# Patient Record
Sex: Female | Born: 1990 | Race: Black or African American | Hispanic: No | Marital: Single | Smoking: Current every day smoker
Health system: Southern US, Community
[De-identification: ages and names within clinical notes are randomized; demographics above are authoritative.]

## PROBLEM LIST (undated history)

## (undated) ENCOUNTER — Emergency Department (HOSPITAL_COMMUNITY): Payer: Self-pay

## (undated) DIAGNOSIS — D649 Anemia, unspecified: Secondary | ICD-10-CM

## (undated) DIAGNOSIS — N289 Disorder of kidney and ureter, unspecified: Secondary | ICD-10-CM

## (undated) DIAGNOSIS — N200 Calculus of kidney: Secondary | ICD-10-CM

## (undated) DIAGNOSIS — E282 Polycystic ovarian syndrome: Secondary | ICD-10-CM

---
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
## 2010-06-30 ENCOUNTER — Encounter: Payer: Self-pay | Admitting: Pediatrics

## 2010-08-27 LAB — POCT RAPID STREP A (OFFICE): Streptococcus, Group A Screen (Direct): NEGATIVE

## 2010-12-25 ENCOUNTER — Other Ambulatory Visit (HOSPITAL_COMMUNITY): Payer: Self-pay | Admitting: Obstetrics and Gynecology

## 2010-12-25 DIAGNOSIS — R102 Pelvic and perineal pain: Secondary | ICD-10-CM

## 2010-12-25 DIAGNOSIS — E282 Polycystic ovarian syndrome: Secondary | ICD-10-CM

## 2010-12-30 ENCOUNTER — Ambulatory Visit (HOSPITAL_COMMUNITY)
Admission: RE | Admit: 2010-12-30 | Discharge: 2010-12-30 | Disposition: A | Discharge status: Home / Self Care | Payer: Medicaid Other | Source: Ambulatory Visit | Attending: Obstetrics and Gynecology | Admitting: Obstetrics and Gynecology

## 2010-12-30 DIAGNOSIS — N949 Unspecified condition associated with female genital organs and menstrual cycle: Secondary | ICD-10-CM | POA: Insufficient documentation

## 2010-12-30 DIAGNOSIS — R102 Pelvic and perineal pain: Secondary | ICD-10-CM

## 2010-12-30 DIAGNOSIS — E282 Polycystic ovarian syndrome: Secondary | ICD-10-CM

## 2010-12-30 DIAGNOSIS — N926 Irregular menstruation, unspecified: Secondary | ICD-10-CM | POA: Insufficient documentation

## 2011-01-18 ENCOUNTER — Emergency Department (HOSPITAL_COMMUNITY)
Admission: EM | Admit: 2011-01-18 | Discharge: 2011-01-18 | Disposition: A | Discharge status: Home / Self Care | Payer: Medicaid Other | Attending: Emergency Medicine | Admitting: Emergency Medicine

## 2011-01-18 ENCOUNTER — Emergency Department (HOSPITAL_COMMUNITY): Payer: Medicaid Other

## 2011-01-18 DIAGNOSIS — IMO0002 Reserved for concepts with insufficient information to code with codable children: Secondary | ICD-10-CM | POA: Insufficient documentation

## 2011-01-18 DIAGNOSIS — Y9355 Activity, bike riding: Secondary | ICD-10-CM | POA: Insufficient documentation

## 2011-01-18 DIAGNOSIS — N912 Amenorrhea, unspecified: Secondary | ICD-10-CM | POA: Insufficient documentation

## 2011-01-18 DIAGNOSIS — F988 Other specified behavioral and emotional disorders with onset usually occurring in childhood and adolescence: Secondary | ICD-10-CM | POA: Insufficient documentation

## 2011-01-18 DIAGNOSIS — S20219A Contusion of unspecified front wall of thorax, initial encounter: Secondary | ICD-10-CM | POA: Insufficient documentation

## 2011-01-18 DIAGNOSIS — R071 Chest pain on breathing: Secondary | ICD-10-CM | POA: Insufficient documentation

## 2011-02-27 ENCOUNTER — Inpatient Hospital Stay (INDEPENDENT_AMBULATORY_CARE_PROVIDER_SITE_OTHER)
Admission: RE | Admit: 2011-02-27 | Discharge: 2011-02-27 | Disposition: A | Discharge status: Home / Self Care | Payer: Medicaid Other | Source: Ambulatory Visit | Attending: Physician Assistant | Admitting: Physician Assistant

## 2011-02-27 ENCOUNTER — Ambulatory Visit (INDEPENDENT_AMBULATORY_CARE_PROVIDER_SITE_OTHER): Payer: Medicaid Other

## 2011-02-27 DIAGNOSIS — J019 Acute sinusitis, unspecified: Secondary | ICD-10-CM

## 2011-02-27 DIAGNOSIS — J45909 Unspecified asthma, uncomplicated: Secondary | ICD-10-CM

## 2011-02-27 LAB — POCT PREGNANCY, URINE: Preg Test, Ur: NEGATIVE

## 2011-03-06 LAB — CBC
HCT: 37.2
Hemoglobin: 11.9 — ABNORMAL LOW
MCV: 71 — ABNORMAL LOW
RDW: 13.5

## 2011-03-06 LAB — DIFFERENTIAL
Basophils Absolute: 0.1
Eosinophils Absolute: 0.3
Eosinophils Relative: 7 — ABNORMAL HIGH
Lymphocytes Relative: 36
Lymphs Abs: 1.8
Monocytes Absolute: 0.5

## 2011-03-06 LAB — URINALYSIS, ROUTINE W REFLEX MICROSCOPIC
Bilirubin Urine: NEGATIVE
Glucose, UA: NEGATIVE
Ketones, ur: NEGATIVE
Protein, ur: NEGATIVE
pH: 6.5

## 2011-03-06 LAB — POCT I-STAT, CHEM 8
BUN: 8
Chloride: 105
Creatinine, Ser: 0.7
Potassium: 3.6
Sodium: 141

## 2011-03-06 LAB — POCT PREGNANCY, URINE: Operator id: 284251

## 2011-03-06 LAB — URINE MICROSCOPIC-ADD ON

## 2011-03-06 LAB — GC/CHLAMYDIA PROBE AMP, GENITAL: GC Probe Amp, Genital: NEGATIVE

## 2011-05-22 ENCOUNTER — Emergency Department (HOSPITAL_COMMUNITY)
Admission: EM | Admit: 2011-05-22 | Discharge: 2011-05-22 | Disposition: A | Discharge status: Home / Self Care | Payer: Medicaid Other | Attending: Emergency Medicine | Admitting: Emergency Medicine

## 2011-05-22 DIAGNOSIS — R599 Enlarged lymph nodes, unspecified: Secondary | ICD-10-CM | POA: Insufficient documentation

## 2011-05-22 DIAGNOSIS — R220 Localized swelling, mass and lump, head: Secondary | ICD-10-CM | POA: Insufficient documentation

## 2011-05-22 DIAGNOSIS — J45909 Unspecified asthma, uncomplicated: Secondary | ICD-10-CM | POA: Insufficient documentation

## 2011-05-22 DIAGNOSIS — R51 Headache: Secondary | ICD-10-CM | POA: Insufficient documentation

## 2011-05-22 DIAGNOSIS — L089 Local infection of the skin and subcutaneous tissue, unspecified: Secondary | ICD-10-CM | POA: Insufficient documentation

## 2011-05-22 HISTORY — DX: Polycystic ovarian syndrome: E28.2

## 2011-05-22 MED ORDER — LIDOCAINE-EPINEPHRINE 1 %-1:100000 IJ SOLN
INTRAMUSCULAR | Status: AC
  Filled 2011-05-22: qty 1

## 2011-05-22 MED ORDER — CEPHALEXIN 250 MG PO CAPS
500.0000 mg | ORAL_CAPSULE | Freq: Once | ORAL | Status: AC
  Administered 2011-05-22: 500 mg via ORAL
  Filled 2011-05-22: qty 2

## 2011-05-22 MED ORDER — CEPHALEXIN 500 MG PO CAPS: 500.0000 mg | ORAL_CAPSULE | Freq: Two times a day (BID) | ORAL | Status: AC

## 2011-05-22 MED ORDER — HYDROCODONE-ACETAMINOPHEN 5-325 MG PO TABS
2.0000 | ORAL_TABLET | Freq: Once | ORAL | Status: AC
  Administered 2011-05-22: 2 via ORAL
  Filled 2011-05-22: qty 2

## 2011-05-22 MED ORDER — HYDROCODONE-ACETAMINOPHEN 5-325 MG PO TABS: 2.0000 | ORAL_TABLET | Freq: Four times a day (QID) | ORAL | Status: AC | PRN

## 2011-05-22 NOTE — ED Notes (Signed)
Pt. Had earring pplaced under her rt. Lip,the are is swollen, red and painful. Pt. Went to [facility] today and was given a tetnus and also instructed to come to [place] to have it taken out.

## 2011-05-22 NOTE — ED Provider Notes (Signed)
History     CSN: [id number] Arrival date & time: 05/22/2011 12:13 PM   First MD Initiated Contact with Patient 05/22/11 1308      Chief Complaint  Patient presents with  . Facial Pain    (Consider location/radiation/quality/duration/timing/severity/associated sxs/prior treatment) HPI Complains of facial pain and swelling onset 3 days ago after having stuck type earrings placed in her lower lip. No other trauma no trismus no treatment prior to coming here no other associated complaint. Patient last ate 12 midnight tioday Past Medical History  Diagnosis Date  . Asthma   . Polycystic ovarian syndrome     History reviewed. No pertinent past surgical history.  History reviewed. No pertinent family history.  History  Substance Use Topics  . Smoking status: Current Everyday Smoker  . Smokeless tobacco: Not on file  . Alcohol Use: Yes    OB History    Grav Para Term Preterm Abortions TAB SAB Ect Mult Living                  Review of Systems  Constitutional: Negative.   HENT: Positive for facial swelling.   Respiratory: Negative.   Cardiovascular: Negative.   Gastrointestinal: Negative.   Musculoskeletal: Negative.   Skin: Negative.   Neurological: Negative.   Hematological: Negative.   Psychiatric/Behavioral: Negative.     Allergies  Amoxicillin  Home Medications   Current Outpatient Rx  Name Route Sig Dispense Refill  . ALBUTEROL SULFATE HFA 108 (90 BASE) MCG/ACT IN AERS Inhalation Inhale 2 puffs into the lungs daily as needed. For shortness of breath/wheezing       BP 135/83  Pulse 62  Temp(Src) 98.7 F (37.1 C) (Oral)  Resp 20  Ht 5\' 6"  (1.676 m)  Wt 118 lb (53.524 kg)  BMI 19.05 kg/m2  SpO2 100%  LMP 03/22/2011  Physical Exam  Nursing note and vitals reviewed. Constitutional: She appears well-developed and well-nourished.  HENT:  Head: Normocephalic and atraumatic.       Lower lip with earrings placed bilaterally with mild surrounding soft  tissue swelling and tenderness. No trismus  Eyes: Conjunctivae are normal. Pupils are equal, round, and reactive to light.  Neck: Neck supple. No tracheal deviation present. No thyromegaly present.  Cardiovascular: Normal rate.   No murmur heard. Pulmonary/Chest: Effort normal.  Abdominal: Soft. Bowel sounds are normal. She exhibits no distension. There is no tenderness.  Musculoskeletal: Normal range of motion. She exhibits no edema.  Lymphadenopathy:    She has cervical adenopathy.  Neurological: She is alert. Coordination normal.  Skin: Skin is warm and dry. No rash noted.  Psychiatric: She has a normal mood and affect.    ED Course  Procedures (including critical care time)  Labs Reviewed - No data to display No results found.  Both urines were removed from patient slipped by the nurse using a hemostat. Slight drainage from piercing on the left No diagnosis found.    MDM  Plan prescription Keflex,(note patient's reaction to amoxicillin is nausea, not felt to be an allergy) hydrocodone-Apap.Marland Kitchen Salt water rinses Return if worse for any reason Diagnosis#1 foreign body removal #2 soft tissue infection the face         Doug Sou, MD 05/22/11 1354

## 2011-05-22 NOTE — ED Notes (Signed)
Pt given ice pack

## 2015-12-14 ENCOUNTER — Emergency Department (HOSPITAL_COMMUNITY)
Admission: EM | Admit: 2015-12-14 | Discharge: 2015-12-14 | Disposition: A | Discharge status: Home / Self Care | Payer: Self-pay | Attending: Emergency Medicine | Admitting: Emergency Medicine

## 2015-12-14 ENCOUNTER — Encounter (HOSPITAL_COMMUNITY): Payer: Self-pay | Admitting: *Deleted

## 2015-12-14 ENCOUNTER — Emergency Department (HOSPITAL_COMMUNITY): Payer: Self-pay

## 2015-12-14 DIAGNOSIS — R112 Nausea with vomiting, unspecified: Secondary | ICD-10-CM | POA: Insufficient documentation

## 2015-12-14 DIAGNOSIS — E876 Hypokalemia: Secondary | ICD-10-CM | POA: Insufficient documentation

## 2015-12-14 DIAGNOSIS — F17200 Nicotine dependence, unspecified, uncomplicated: Secondary | ICD-10-CM | POA: Insufficient documentation

## 2015-12-14 DIAGNOSIS — J45909 Unspecified asthma, uncomplicated: Secondary | ICD-10-CM | POA: Insufficient documentation

## 2015-12-14 DIAGNOSIS — R319 Hematuria, unspecified: Secondary | ICD-10-CM | POA: Insufficient documentation

## 2015-12-14 DIAGNOSIS — R52 Pain, unspecified: Secondary | ICD-10-CM

## 2015-12-14 DIAGNOSIS — R1011 Right upper quadrant pain: Secondary | ICD-10-CM

## 2015-12-14 HISTORY — DX: Disorder of kidney and ureter, unspecified: N28.9

## 2015-12-14 HISTORY — DX: Calculus of kidney: N20.0

## 2015-12-14 LAB — CBC WITH DIFFERENTIAL/PLATELET
BASOS ABS: 0 10*3/uL (ref 0.0–0.1)
Basophils Relative: 0 %
EOS ABS: 0.2 10*3/uL (ref 0.0–0.7)
Eosinophils Relative: 2 %
HCT: 36.9 % (ref 36.0–46.0)
HEMOGLOBIN: 12.2 g/dL (ref 12.0–15.0)
LYMPHS PCT: 14 %
Lymphs Abs: 1.1 10*3/uL (ref 0.7–4.0)
MCH: 23.5 pg — ABNORMAL LOW (ref 26.0–34.0)
MCHC: 33.1 g/dL (ref 30.0–36.0)
MCV: 71.1 fL — ABNORMAL LOW (ref 78.0–100.0)
Monocytes Absolute: 0.5 10*3/uL (ref 0.1–1.0)
Monocytes Relative: 7 %
NEUTROS ABS: 5.8 10*3/uL (ref 1.7–7.7)
NEUTROS PCT: 77 %
Platelets: 245 10*3/uL (ref 150–400)
RBC: 5.19 MIL/uL — ABNORMAL HIGH (ref 3.87–5.11)
RDW: 13.6 % (ref 11.5–15.5)
WBC: 7.6 10*3/uL (ref 4.0–10.5)

## 2015-12-14 LAB — COMPREHENSIVE METABOLIC PANEL
ALBUMIN: 4.2 g/dL (ref 3.5–5.0)
ALK PHOS: 60 U/L (ref 38–126)
ALT: 19 U/L (ref 14–54)
ANION GAP: 8 (ref 5–15)
AST: 36 U/L (ref 15–41)
BUN: 11 mg/dL (ref 6–20)
CHLORIDE: 106 mmol/L (ref 101–111)
CO2: 23 mmol/L (ref 22–32)
CREATININE: 0.8 mg/dL (ref 0.44–1.00)
Calcium: 9.5 mg/dL (ref 8.9–10.3)
GFR calc Af Amer: >60 mL/min (ref >60)
GFR calc non Af Amer: >60 mL/min (ref >60)
GLUCOSE: 152 mg/dL — AB (ref 65–99)
Potassium: 3.2 mmol/L — ABNORMAL LOW (ref 3.5–5.1)
SODIUM: 137 mmol/L (ref 135–145)
Total Bilirubin: 1.2 mg/dL (ref 0.3–1.2)
Total Protein: 7 g/dL (ref 6.5–8.1)

## 2015-12-14 LAB — URINALYSIS, ROUTINE W REFLEX MICROSCOPIC
Bilirubin Urine: NEGATIVE
GLUCOSE, UA: NEGATIVE mg/dL
KETONES UR: 15 mg/dL — AB
NITRITE: NEGATIVE
PROTEIN: 30 mg/dL — AB
Specific Gravity, Urine: 1.029 (ref 1.005–1.030)
pH: 6 (ref 5.0–8.0)

## 2015-12-14 LAB — URINE MICROSCOPIC-ADD ON

## 2015-12-14 LAB — I-STAT BETA HCG BLOOD, ED (MC, WL, AP ONLY)

## 2015-12-14 LAB — LIPASE, BLOOD: Lipase: 18 U/L (ref 11–51)

## 2015-12-14 MED ORDER — FAMOTIDINE 20 MG PO TABS: 20.0000 mg | ORAL_TABLET | Freq: Two times a day (BID) | ORAL | Status: DC

## 2015-12-14 MED ORDER — ONDANSETRON HCL 4 MG/2ML IJ SOLN
4.0000 mg | Freq: Once | INTRAMUSCULAR | Status: AC
  Administered 2015-12-14: 4 mg via INTRAVENOUS
  Filled 2015-12-14: qty 2

## 2015-12-14 MED ORDER — DICYCLOMINE HCL 10 MG PO CAPS: 20.0000 mg | ORAL_CAPSULE | Freq: Four times a day (QID) | ORAL | Status: DC | PRN

## 2015-12-14 MED ORDER — OXYCODONE-ACETAMINOPHEN 5-325 MG PO TABS: ORAL_TABLET | ORAL | Status: DC

## 2015-12-14 MED ORDER — DICYCLOMINE HCL 10 MG PO CAPS
10.0000 mg | ORAL_CAPSULE | Freq: Once | ORAL | Status: AC
  Administered 2015-12-14: 10 mg via ORAL
  Filled 2015-12-14: qty 1

## 2015-12-14 MED ORDER — MORPHINE SULFATE (PF) 4 MG/ML IV SOLN
4.0000 mg | Freq: Once | INTRAVENOUS | Status: AC
  Administered 2015-12-14: 4 mg via INTRAVENOUS
  Filled 2015-12-14: qty 1

## 2015-12-14 MED ORDER — FAMOTIDINE 20 MG PO TABS
20.0000 mg | ORAL_TABLET | Freq: Once | ORAL | Status: AC
  Administered 2015-12-14: 20 mg via ORAL
  Filled 2015-12-14: qty 1

## 2015-12-14 MED ORDER — SODIUM CHLORIDE 0.9 % IV BOLUS (SEPSIS)
1000.0000 mL | Freq: Once | INTRAVENOUS | Status: AC
  Administered 2015-12-14: 1000 mL via INTRAVENOUS

## 2015-12-14 MED ORDER — POTASSIUM CHLORIDE CRYS ER 20 MEQ PO TBCR: 20.0000 meq | EXTENDED_RELEASE_TABLET | Freq: Every day | ORAL | Status: DC

## 2015-12-14 MED ORDER — ONDANSETRON HCL 4 MG PO TABS: 4.0000 mg | ORAL_TABLET | Freq: Three times a day (TID) | ORAL | Status: DC | PRN

## 2015-12-14 NOTE — Discharge Instructions (Signed)
Push fluids: take small frequent sips of water or Gatorade, do not drink any soda, juice or caffeinated beverages.    Slowly resume solid diet as desired. Avoid food that are spicy, contain dairy and/or have high fat content.  He will need a recheck of the blood that was seen in your urine today, if you can get in to see primary care in 2 weeks you can see the urgent care for recheck, if there is still blood in your urine you'll need to follow with [facility].  Do not hesitate to return to the emergency room for any new, worsening or concerning symptoms.  Please obtain primary care using resource guide below. Let them know that you were seen in the emergency room and that they will need to obtain records for further outpatient management.     Abdominal Pain, Adult Many things can cause abdominal pain. Usually, abdominal pain is not caused by a disease and will improve without treatment. It can often be observed and treated at home. Your health care provider will do a physical exam and possibly order blood tests and X-rays to help determine the seriousness of your pain. However, in many cases, more time must pass before a clear cause of the pain can be found. Before that point, your health care provider may not know if you need more testing or further treatment. HOME CARE INSTRUCTIONS Monitor your abdominal pain for any changes. The following actions may help to alleviate any discomfort you are experiencing:  Only take over-the-counter or prescription medicines as directed by your health care provider.  Do not take laxatives unless directed to do so by your health care provider.  Try a clear liquid diet (broth, tea, or water) as directed by your health care provider. Slowly move to a bland diet as tolerated. SEEK MEDICAL CARE IF:  You have unexplained abdominal pain.  You have abdominal pain associated with nausea or diarrhea.  You have pain when you urinate or have a bowel  movement.  You experience abdominal pain that wakes you in the night.  You have abdominal pain that is worsened or improved by eating food.  You have abdominal pain that is worsened with eating fatty foods.  You have a fever. SEEK IMMEDIATE MEDICAL CARE IF:  Your pain does not go away within 2 hours.  You keep throwing up (vomiting).  Your pain is felt only in portions of the abdomen, such as the right side or the left lower portion of the abdomen.  You pass bloody or black tarry stools. MAKE SURE YOU:  Understand these instructions.  Will watch your condition.  Will get help right away if you are not doing well or get worse.   This information is not intended to replace advice given to you by your health care provider. Make sure you discuss any questions you have with your health care provider.   Document Released: 03/05/2005 Document Revised: 02/14/2015 Document Reviewed: 02/02/2013 Elsevier Interactive Patient Education 2016 Elsevier Inc.    Document Released: 05/26/2005 Document Revised: 02/14/2015 Document Reviewed: 10/27/2012 Elsevier Interactive Patient Education 2016 [employer]. [employer] Assistance The United Ways 211 is a great source of information about community services available.  Access by dialing 2-1-1 from anywhere in [place], or by website -  PooledIncome.pl.   Other Local Resources (Updated 06/2015)  Scientist, forensic    Phone Number and Address  [facility]  Low-cost medical care - 1st and  3rd Saturday of every month  Must not qualify for public or private insurance and must have limited income [phone] [address] [place], [place]     [place] Department of Social Services  Child care  Emergency assistance for housing and Kimberly-Clarkutilities  Food stamps  Medicaid [phone] [address] [place], [place] [address]   [facility]   Low-cost medical care for children, communicable diseases, sexually-transmitted diseases, immunizations, maternity care, womens health and family planning [phone] [address] [place], [place] [address]  [facility]   Medication assistance for [facility] residents  Must meet income requirements [phone] [address] [place], [place].    [facility]  Child care  Emergency assistance for housing and Kimberly-Clarkutilities  Food stamps  Medicaid [phone] [address] [place], [place] [address]  [facility]   Low-cost medical care,   Monday through Friday, 9 am to 6 pm.   Accepts Medicare/Medicaid, and self-pay [phone] [address] [place], [place] [address]  [facility]  Low-cost medical care - Monday through Friday, 8:30 am - 5:30 pm  Accepts Medicaid and self-pay [phone] [address] [place], [place] [address]   [facility]  Primary medical care, including for those with sickle cell disease  Accepts Medicare, Medicaid, insurance and self-pay [phone] [address] [place], [place]  [facility]   Primary medical care  Accepts Medicare, [place], insurance and self-pay [phone] [address] [place], [place] [address]   [facility] Department of Social Services  Child care  Emergency assistance for housing and Kimberly-Clarkutilities  Food stamps  Medicaid [phone] [address] [place], [place] [address]  [facility] Department of Health and [employer]MaxHuman Services  Child care  Emergency assistance for housing and Kimberly-Clarkutilities  Food stamps  Medicaid [phone] [address] [place], [place] [address]   [facility]  Medication assistance for [facility] [place] residents with no insurance only  Must have a  primary care doctor [phone] [address], [address] [place], [place]  [facility]   Primary medical care  [place] Medicare, [place]icaid, insurance  [phone] [address] [place], [place]  MedAssist   Medication assistance [phone]  [facility]   Primary medical care  Accepts Medicare, [place]icaid, insurance and self-pay [phone] [address] [place], [place] [address]  [facility]   Primary medical care  Accepts Medicare, [place]icaid, insurance and self-pay [phone] [address] [place], [place] [address]  [facility]  For [place] residents between the ages of 11018 and 7564 who do not have any form of health insurance, Medicare, [place]icaid, or [place] benefits.  Services are provided free of charge to uninsured patients who fall within federal poverty guidelines.    Hours: Tuesdays and Thursdays, 4:15 - 8 pm [phone] [address], [address] [place], [place] [address]  [facility]     Primary medical care  Dental care  Nutritional counseling  Pharmacy  Accepts Medicaid, Medicare, most insurance.  Fees are adjusted based on ability to pay.   [phone] [facility] [address] [place], [place]  [phone] Phineas Realharles Drew [facility] [address] [place], [place]  [phone] [facility] [place], [place]  [phone] [facility], [address] [place],  [place]  [phone] [facility] [address] [place], [place]  [facility]  Womens health and family planning [phone] [address] [place], [place]  [facility] of Social Services  Child care  Emergency assistance for housing and Kimberly-Clarkutilities  Food stamps  Medicaid [phone] [address], [place], [place] [address]   [facility]     Ages 8018 and older  Hours: Mondays and Thursdays, 7:00 am - 9:00 am Patients are seen on a first come, first served basis. [phone] [address] [place], [place]  [facility] Division of Social Services  Child care  Emergency assistance for housing and Kimberly-Clarkutilities  Food stamps  Medicaid [phone] [address] [place], [place] [address]  [facility] [employer]  Medication assistance  Rental assistance  Food pantry  Medication assistance  Housing assistance  Emergency food distribution  Utility assistance [phone] [address] [place], [place]  [phone]  [address] [place], [place] [address] Hours: Tuesdays and Thursdays from 9am - 12 noon by appointment only  [phone] [address] [place], [place] [address]  [facility] Lanae Boastlara F. Gunn   Accepts private insurance, [place]icare, and [place]icaid.  Payment is based on a sliding scale for those without insurance.  Hours: Mondays, Tuesdays and Thursdays, 8:30 am - 5:30 pm.   [phone] [address] [place], [place]  [facility] - Family Medicine at [facility]    Accepts private insurance, [place]icare, and [place]icaid.  Payment is based on a sliding scale for those without insurance. [phone] [address] [place], [place]  [facility] - Pediatrics at E. Scientist, research (physical sciences)[place]  Accepts private insurance, Harrah[employer] EntertainmentMedicare, and [place]icaid.  Payment is based on a sliding scale for those without insurance [phone] [address], [place], [place]  [facility] - Pediatrics at [place]  Accepts private insurance, [place] CityMedicare, and [place]icaid.  Payment is based on a sliding scale for those without insurance. [phone] [address] [place], [place]  [facility] - Pediatrics at [facility]  Accepts private insurance, [place]icare, and [place]icaid.  Payment is based on a sliding  scale for those without insurance. [phone], [phone] [place], [place].    [facility]  Maternity care.  Accepts Medicaid and self-pay. [phone] [address] [place], [place]

## 2015-12-14 NOTE — ED Provider Notes (Signed)
CSN: [id number]     Arrival date & time 12/14/15  0302 History   First MD Initiated Contact with Patient 12/14/15 [phone]     Chief Complaint  Patient presents with  . Abdominal Pain     (Consider location/radiation/quality/duration/timing/severity/associated sxs/prior Treatment) HPI   Patient is a 25 year old female past medical history of asthma, PCOS, kidney stone who presents the ED with complaint of abdominal pain, onset 2 days. Patient reports over the past 2 days she has had a constant gradually worsening pain to her right upper quadrant which she notes radiates around to her right mid back. Denies any aggravating or alleviating factors. Endorses associated nausea and NBNB vomiting. Patient denies taking any medications prior to arrival. Denies fever, chills, cough, shortness of breath, chest pain, diarrhea, urinary symptoms, vaginal bleeding, vaginal discharge. Patient reports her last menstrual period was approximately one month ago. Denies history of abdominal surgeries.  Past Medical History  Diagnosis Date  . Asthma   . Polycystic ovarian syndrome   . Renal disorder   . Kidney stone    History reviewed. No pertinent past surgical history. No family history on file. Social History  Substance Use Topics  . Smoking status: Current Every Day Smoker  . Smokeless tobacco: None  . Alcohol Use: Yes   OB History    No data available     Review of Systems  Gastrointestinal: Positive for nausea, vomiting and abdominal pain.  Musculoskeletal: Positive for back pain.  All other systems reviewed and are negative.     Allergies  Amoxicillin  Home Medications   Prior to Admission medications   Medication Sig Start Date End Date Taking? Authorizing Provider  albuterol (PROVENTIL HFA;VENTOLIN HFA) 108 (90 BASE) MCG/ACT inhaler Inhale 2 puffs into the lungs daily as needed. For shortness of breath/wheezing     Historical Provider, MD   BP 122/67 mmHg  Pulse 58  Temp(Src) 98.7  F (37.1 C) (Oral)  Resp 19  SpO2 100% Physical Exam  Constitutional: She is oriented to person, place, and time. She appears well-developed and well-nourished.  Patient moaning and rolling around in bed holding her stomach.  HENT:  Head: Normocephalic and atraumatic.  Mouth/Throat: Oropharynx is clear and moist. No oropharyngeal exudate.  Eyes: Conjunctivae and EOM are normal. Right eye exhibits no discharge. Left eye exhibits no discharge. No scleral icterus.  Neck: Normal range of motion. Neck supple.  Cardiovascular: Normal rate, regular rhythm, normal heart sounds and intact distal pulses.   Pulmonary/Chest: Effort normal and breath sounds normal. No respiratory distress. She has no wheezes. She has no rales. She exhibits no tenderness.  Abdominal: Soft. Bowel sounds are normal. She exhibits no distension and no mass. There is tenderness (RUQ, epigastric, LUQ). There is no rebound and no guarding.  Bilateral CVA tenderness  Musculoskeletal: Normal range of motion. She exhibits no edema.  Neurological: She is alert and oriented to person, place, and time.  Skin: Skin is warm and dry. She is not diaphoretic.  Nursing note and vitals reviewed.   ED Course  Procedures (including critical care time) Labs Review Labs Reviewed  CBC WITH DIFFERENTIAL/PLATELET - Abnormal; Notable for the following:    RBC 5.19 (*)    MCV 71.1 (*)    MCH 23.5 (*)    All other components within normal limits  COMPREHENSIVE METABOLIC PANEL - Abnormal; Notable for the following:    Potassium 3.2 (*)    Glucose, Bld 152 (*)    All other components  within normal limits  LIPASE, BLOOD  URINALYSIS, ROUTINE W REFLEX MICROSCOPIC (NOT AT [facility])  I-STAT BETA HCG BLOOD, ED ([facility], [facility], [facility] ONLY)    Imaging Review No results found. I have personally reviewed and evaluated these images and lab results as part of my medical decision-making.   EKG Interpretation None      MDM   Final diagnoses:  None   Pt  presents with upper abdominal pain radiating to right mid back with associated N/V. Denies fever. VSS. Exam revealed upper abdomen TTP, no peritoneal signs, bilateral CVA tenderness. Pt given IVF, pain meds and antiemetics. Pregnancy negative. UA showed large hgb, TNTC RBCs, no signs of infection. Will order CT renal for further evaluation. On reevaluation pt reports her pain has improved, denies any episodes of emesis while in the ED.   Hand-off to [employer] Corporation[employer]ole Pisciotta , PA-C. CT renal pending. Dispo pending imaging results. If CT shows small uncomplicated stone, plan to d/c pt home with pain meds, zofran and outpatient urology follow up.     Satira Sarkicole Elizabeth HeclaNadeau, [place] JerseyPA-C 12/14/15 0559  Azalia BilisKevin Campos, MD 12/14/15 [phone]

## 2015-12-14 NOTE — ED Provider Notes (Signed)
PROGRESS NOTE                                                                                                                 This is a sign-out from PA Nadeu at shift change: Angela Cobb is a 25 y.o. female presenting Angela Connwith abdominal pain and hematuria consistent with prior episodes of kidney stone. Patient has never seen a urologist, has never required intervention to pass the stone, afebrile and overall well appearing, urinalysis without signs of infection. Plan is to follow-up CT stone protocol, DC home with percocet and zofran and urology f/uPlease refer to previous note for full HPI, ROS, PMH and PE.   CT is negative. Patient seen and evaluated the bedside, she is resting comfortably, states that her pain is improved. States that she has right flank pain radiating around to the right upper quadrant onset 2 days ago associated with vomiting and vomiting and no nausea. Abdominal exam is benign with very mild tenderness palpation the right upper quadrant, will obtain RUQ ultrasound and PO challenge.   Right upper quadrant ultrasound is negative for acute abnormality. Patient is tolerating by mouth and repeat abdominal exam is benign.  Filed Vitals:   12/14/15 0745 12/14/15 0800 12/14/15 0815 12/14/15 0830  BP: 97/49 98/53 99/52  96/48  Pulse: 49 57 57 59  Temp:      TempSrc:      Resp:      SpO2: 100% 100% 100% 100%    Medications  sodium chloride 0.9 % bolus 1,000 mL (0 mLs Intravenous Stopped 12/14/15 0540)  ondansetron (ZOFRAN) injection 4 mg (4 mg Intravenous Given 12/14/15 0340)  morphine 4 MG/ML injection 4 mg (4 mg Intravenous Given 12/14/15 0341)  dicyclomine (BENTYL) capsule 10 mg (10 mg Oral Given 12/14/15 0913)  famotidine (PEPCID) tablet 20 mg (20 mg Oral Given 12/14/15 0913)    Evaluation does not show pathology that would require ongoing emergent intervention or inpatient treatment. Pt is hemodynamically stable and mentating appropriately. Discussed findings and plan with  patient/guardian, who agrees with care plan. All questions answered. Return precautions discussed and outpatient follow up given.   New Prescriptions   ONDANSETRON (ZOFRAN) 4 MG TABLET    Take 1 tablet (4 mg total) by mouth every 8 (eight) hours as needed for nausea or vomiting.   OXYCODONE-ACETAMINOPHEN (PERCOCET/ROXICET) 5-325 MG TABLET    1 to 2 tabs PO q6hrs  PRN for pain        Wynetta Emeryicole Ashaunti Treptow, PA-C 12/14/15 1042  Azalia BilisKevin Campos, MD 12/15/15 [phone]

## 2015-12-14 NOTE — ED Notes (Signed)
Patient still in ultrasound.

## 2015-12-14 NOTE — ED Notes (Signed)
Pt arrives via POV. Pt reports right upper abdominal pain that radiates around to the back, associated with nausea and vomiting for two days and pain with urination. The pt reports history of kidney stones "feels worse."

## 2016-08-11 ENCOUNTER — Ambulatory Visit (HOSPITAL_COMMUNITY)
Admission: EM | Admit: 2016-08-11 | Discharge: 2016-08-11 | Disposition: A | Discharge status: Home / Self Care | Payer: Medicaid Other | Attending: Family Medicine | Admitting: Family Medicine

## 2016-08-11 ENCOUNTER — Encounter (HOSPITAL_COMMUNITY): Payer: Self-pay | Admitting: Emergency Medicine

## 2016-08-11 DIAGNOSIS — J40 Bronchitis, not specified as acute or chronic: Secondary | ICD-10-CM | POA: Diagnosis not present

## 2016-08-11 MED ORDER — SULFAMETHOXAZOLE-TRIMETHOPRIM 800-160 MG PO TABS: 1.0000 | ORAL_TABLET | Freq: Two times a day (BID) | ORAL | 0 refills | Status: AC

## 2016-08-11 MED ORDER — PREDNISONE 20 MG PO TABS: ORAL_TABLET | ORAL | 0 refills | Status: DC

## 2016-08-11 NOTE — ED Provider Notes (Signed)
[facility]    CSN: [id number] Arrival date & time: 08/11/16  1322     History   Chief Complaint Chief Complaint  Patient presents with  . URI    HPI Angela Cobb is a 26 y.o. female.   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 She has not had a fever for ear pain. Said no nausea or vomiting. She lives with 2 other people that have had colds as well.  Patient smokes and has a history of asthma although she thought this had resolved by the time she was done with her teenage years. She does want to quit smoking.      Past Medical History:  Diagnosis Date  . Asthma   . Kidney stone   . Polycystic ovarian syndrome   . Renal disorder     There are no active problems to display for this patient.   History reviewed. No pertinent surgical history.  OB History    No data available       Home Medications    Prior to Admission medications   Medication Sig Start Date End Date Taking? Authorizing Provider  albuterol (PROVENTIL HFA;VENTOLIN HFA) 108 (90 BASE) MCG/ACT inhaler Inhale 2 puffs into the lungs daily as needed. For shortness of breath/wheezing     Historical Provider, MD  predniSONE (DELTASONE) 20 MG tablet Two daily with food 08/11/16   Elvina SidleKurt Ashla Murph, MD  sulfamethoxazole-trimethoprim (BACTRIM DS,SEPTRA DS) 800-160 MG tablet Take 1 tablet by mouth 2 (two) times daily. 08/11/16 08/18/16  Elvina SidleKurt Alayla Dethlefs, MD    Family History No family history on file.  Social History Social History  Substance Use Topics  . Smoking status: Current Every Day Smoker  . Smokeless tobacco: Not on file  . Alcohol use No     Allergies   Amoxicillin   Review of Systems Review of Systems  Constitutional: Positive for fatigue.  HENT: Positive for sore throat.   Respiratory: Positive for cough.   Gastrointestinal: Negative.   Musculoskeletal: Negative.   Neurological: Negative.        Physical Exam Triage Vital Signs ED Triage Vitals  Enc Vitals Group     BP 08/11/16 1411 118/91     Pulse Rate 08/11/16 1411 87     Resp 08/11/16 1411 18     Temp 08/11/16 1411 98.5 F (36.9 C)     Temp Source 08/11/16 1411 Oral     SpO2 08/11/16 1411 100 %     Weight --      Height --      Head Circumference --      Peak Flow --      Pain Score 08/11/16 1410 8     Pain Loc --      Pain Edu? --      Excl. in GC? --    No data found.   Updated Vital Signs BP 118/91 (BP Location: Right Arm)   Pulse 87   Temp 98.5 F (36.9 C) (Oral)   Resp 18   LMP 06/13/2016   SpO2 100%    Physical Exam  Constitutional: She is oriented to person, place, and time. She appears well-developed and well-nourished.  HENT:  Right Ear: External ear normal.  Left Ear: External ear normal.  Mouth/Throat: Oropharynx is clear and moist.  Eyes: Conjunctivae and EOM are normal. Pupils are equal, round, and reactive to light.  Neck: Normal range of motion. Neck supple.  Pulmonary/Chest: Effort normal. She has wheezes.  Musculoskeletal: Normal range of motion.  Neurological: She is alert and oriented to person, place, and time.  Skin: Skin is warm and dry.  Nursing note and vitals reviewed.    UC Treatments / Results  Labs (all labs ordered are listed, but only abnormal results are displayed) Labs Reviewed - No data to display  EKG  EKG Interpretation None       Radiology No results found.  Procedures Procedures (including critical care time)  Medications Ordered in UC Medications - No data to display   Initial Impression / Assessment and Plan / UC Course  I have reviewed the triage vital signs and the nursing notes.  Pertinent labs & imaging results that were available during my care of the patient were reviewed by me and considered in my medical decision making (see chart for details).     Final Clinical Impressions(s) / UC Diagnoses   Final diagnoses:   Bronchitis    New Prescriptions New Prescriptions   PREDNISONE (DELTASONE) 20 MG TABLET    Two daily with food   SULFAMETHOXAZOLE-TRIMETHOPRIM (BACTRIM DS,SEPTRA DS) 800-160 MG TABLET    Take 1 tablet by mouth 2 (two) times daily.     Elvina Sidle, MD 08/11/16 [phone]

## 2016-08-11 NOTE — ED Triage Notes (Signed)
Itchy throat, ears feel like under water, general aches and pains, cough.  Patient has early morning congestion.  Patient feels like Angela Cobb has difficulty catching breath.  Sputum is gray, thick, cream colored. Patient has had symptoms for 3 weeks

## 2016-08-11 NOTE — Discharge Instructions (Signed)
Do your best to quit smoking. This will make all the difference in your life.

## 2016-08-19 ENCOUNTER — Encounter (HOSPITAL_COMMUNITY): Payer: Self-pay | Admitting: *Deleted

## 2016-08-19 ENCOUNTER — Emergency Department (HOSPITAL_COMMUNITY): Payer: Medicaid Other

## 2016-08-19 ENCOUNTER — Emergency Department (HOSPITAL_COMMUNITY)
Admission: EM | Admit: 2016-08-19 | Discharge: 2016-08-19 | Disposition: A | Discharge status: Home / Self Care | Payer: Medicaid Other | Attending: Emergency Medicine | Admitting: Emergency Medicine

## 2016-08-19 DIAGNOSIS — R079 Chest pain, unspecified: Secondary | ICD-10-CM | POA: Insufficient documentation

## 2016-08-19 DIAGNOSIS — Z5321 Procedure and treatment not carried out due to patient leaving prior to being seen by health care provider: Secondary | ICD-10-CM | POA: Insufficient documentation

## 2016-08-19 LAB — BASIC METABOLIC PANEL
ANION GAP: 8 (ref 5–15)
BUN: 7 mg/dL (ref 6–20)
CALCIUM: 9.2 mg/dL (ref 8.9–10.3)
CO2: 22 mmol/L (ref 22–32)
Chloride: 108 mmol/L (ref 101–111)
Creatinine, Ser: 0.66 mg/dL (ref 0.44–1.00)
GFR calc Af Amer: >60 mL/min (ref >60)
GFR calc non Af Amer: >60 mL/min (ref >60)
GLUCOSE: 119 mg/dL — AB (ref 65–99)
Potassium: 3.6 mmol/L (ref 3.5–5.1)
Sodium: 138 mmol/L (ref 135–145)

## 2016-08-19 LAB — CBC
HEMATOCRIT: 34.7 % — AB (ref 36.0–46.0)
HEMOGLOBIN: 11.4 g/dL — AB (ref 12.0–15.0)
MCH: 23 pg — ABNORMAL LOW (ref 26.0–34.0)
MCHC: 32.9 g/dL (ref 30.0–36.0)
MCV: 70 fL — ABNORMAL LOW (ref 78.0–100.0)
Platelets: 353 10*3/uL (ref 150–400)
RBC: 4.96 MIL/uL (ref 3.87–5.11)
RDW: 12.7 % (ref 11.5–15.5)
WBC: 9 10*3/uL (ref 4.0–10.5)

## 2016-08-19 MED ORDER — ALBUTEROL SULFATE (2.5 MG/3ML) 0.083% IN NEBU
INHALATION_SOLUTION | RESPIRATORY_TRACT | Status: AC
  Filled 2016-08-19: qty 6

## 2016-08-19 MED ORDER — ALBUTEROL SULFATE (2.5 MG/3ML) 0.083% IN NEBU
5.0000 mg | INHALATION_SOLUTION | Freq: Once | RESPIRATORY_TRACT | Status: AC
  Administered 2016-08-19: 5 mg via RESPIRATORY_TRACT

## 2016-08-19 NOTE — ED Notes (Signed)
Unable to locate patient x3.

## 2016-08-19 NOTE — ED Triage Notes (Signed)
Pt c/o chest congestion, chills, NV, and productive cough for a couple of weeks. Reports increased fatigue and weakness. Has been taking dramamine with improvement of nausea. Coughing up cream colored mucus

## 2016-08-19 NOTE — ED Notes (Signed)
Unable to locate pt x2

## 2016-08-22 ENCOUNTER — Ambulatory Visit (HOSPITAL_COMMUNITY)
Admission: EM | Admit: 2016-08-22 | Discharge: 2016-08-22 | Disposition: A | Discharge status: Home / Self Care | Payer: Self-pay | Attending: Emergency Medicine | Admitting: Emergency Medicine

## 2016-08-22 ENCOUNTER — Encounter (HOSPITAL_COMMUNITY): Payer: Self-pay | Admitting: Emergency Medicine

## 2016-08-22 ENCOUNTER — Ambulatory Visit (INDEPENDENT_AMBULATORY_CARE_PROVIDER_SITE_OTHER): Payer: Self-pay

## 2016-08-22 DIAGNOSIS — R0789 Other chest pain: Secondary | ICD-10-CM

## 2016-08-22 DIAGNOSIS — J40 Bronchitis, not specified as acute or chronic: Secondary | ICD-10-CM

## 2016-08-22 DIAGNOSIS — J209 Acute bronchitis, unspecified: Secondary | ICD-10-CM

## 2016-08-22 MED ORDER — AEROCHAMBER PLUS MISC: 2 refills | Status: DC

## 2016-08-22 MED ORDER — DEXAMETHASONE 4 MG PO TABS
ORAL_TABLET | ORAL | Status: AC
  Filled 2016-08-22: qty 4

## 2016-08-22 MED ORDER — IBUPROFEN 600 MG PO TABS: 600.0000 mg | ORAL_TABLET | Freq: Four times a day (QID) | ORAL | 0 refills | Status: DC | PRN

## 2016-08-22 MED ORDER — DEXAMETHASONE 4 MG PO TABS
ORAL_TABLET | ORAL | Status: AC
  Filled 2016-08-22: qty 1

## 2016-08-22 MED ORDER — AZITHROMYCIN 250 MG PO TABS: 250.0000 mg | ORAL_TABLET | Freq: Every day | ORAL | 0 refills | Status: DC

## 2016-08-22 MED ORDER — DEXAMETHASONE 4 MG PO TABS: ORAL_TABLET | ORAL | 0 refills | Status: DC

## 2016-08-22 MED ORDER — IPRATROPIUM-ALBUTEROL 0.5-2.5 (3) MG/3ML IN SOLN
3.0000 mL | Freq: Once | RESPIRATORY_TRACT | Status: AC
  Administered 2016-08-22: 3 mL via RESPIRATORY_TRACT

## 2016-08-22 MED ORDER — ALBUTEROL SULFATE HFA 108 (90 BASE) MCG/ACT IN AERS: 1.0000 | INHALATION_SPRAY | Freq: Four times a day (QID) | RESPIRATORY_TRACT | 0 refills | Status: DC | PRN

## 2016-08-22 MED ORDER — DEXAMETHASONE 4 MG PO TABS
16.0000 mg | ORAL_TABLET | Freq: Once | ORAL | Status: AC
  Administered 2016-08-22: 16 mg via ORAL

## 2016-08-22 MED ORDER — IPRATROPIUM-ALBUTEROL 0.5-2.5 (3) MG/3ML IN SOLN
RESPIRATORY_TRACT | Status: AC
  Filled 2016-08-22: qty 3

## 2016-08-22 NOTE — Discharge Instructions (Signed)
Take the steroid, dexamethasone 16 mg which is 4 pills all at once tomorrow. Take 2 puffs from your albuterol inhaler  every 4-6 hours for the next several days. Take 600 milligrams ibuprofen with 1 g of Tylenol 3-4 times a day. Finish the antibiotics azithromycin. Follow up with a primary care physician of her choice. See list below  Below is a list of primary care practices who are taking new patients for you to follow-up with. [facility] [address] [place] [address] [phone]  [facility] [phone] [address] [place] [address]  [facility]: [address] [place] [address]  [phone]  [facility]: [address] [place] [address]   [phone]  [facility]: [address] [place] [address]  [phone]  [facility] : [address] [place] [address] [phone]  [facility]: [address] [place] [phone]  [facility]: [address] [place] [address] [phone]  Dr. Oneal GroutMahima Pandey [address] [facility] [place] [address]  [phone]  Dr. Jackie PlumGeorge Osei-Bonsu, [facility]. [address] [place] [address]  [phone]

## 2016-08-22 NOTE — ED Triage Notes (Signed)
[facility] a [facility] of a [facility] and SOB x 1 week. The patient was evaluated at the ED for the same complaint but did not wait for discharge to receive instructions or medicines. The patient stated that she needs clearance to go back to work.

## 2016-08-22 NOTE — ED Provider Notes (Signed)
HPI  SUBJECTIVE:  Angela Cobb is a 26 y.o. female who presents with 3 weeks of  hours long, daily intermittent diffuse chest heaviness, tightness, shortness of breath, dyspnea on exertion with long distances, wheezing and cough productive of thick yellowish phlegm. Patient states that she can't sleep secondary to the cough. Reports fatigue, body aches, headaches. She reports constant, achy left lower chest pain in the midaxillary line worse with deep inspiration, smoking, better with fresh air and the albuterol. She has also tried Alka-Seltzer, DayQuil. She does not have an albuterol inhaler at home. She denies any exertional component to the chest heaviness or chest pain. She denies hemoptysis, fevers, unintentional weight loss. No calf pain, lower extremity edema, unintentional weight gain, orthopnea, PND, abdominal pain. No trauma, surgery in the past 4 weeks.  Seen at this facility on 3/5, thought to have bronchitis, sent home with prednisone and Bactrim. She did not fill these prescriptions. She was seen in the ED for the same on 3/13, left after triage. She had a normal chest x-ray, normal CBC and BMP. EKG showed normal sinus rhythm with some T-wave inversion in 3 and aVF, with no ST elevation or reciprocal changes.  Past medical history of asthma, PCOS, pyelonephritis. She is a smoker. No history of CHF, MI, diabetes, hypertension, DVT, PE, hypercoagulability, OCP/exogenous estrogen use. LMP: 2 days ago. Denies possibility of being pregnant. PMD: None  Past Medical History:  Diagnosis Date  . Asthma   . Kidney stone   . Polycystic ovarian syndrome   . Renal disorder     History reviewed. No pertinent surgical history.  History reviewed. No pertinent family history.  Social History  Substance Use Topics  . Smoking status: Current Every Day Smoker  . Smokeless tobacco: Never Used  . Alcohol use No    No current facility-administered medications for this encounter.   Current  Outpatient Prescriptions:  .  dimenhyDRINATE (DRAMAMINE) 50 MG tablet, Take 50 mg by mouth every 8 (eight) hours as needed., Disp: , Rfl:  .  albuterol (PROVENTIL HFA;VENTOLIN HFA) 108 (90 Base) MCG/ACT inhaler, Inhale 1-2 puffs into the lungs every 6 (six) hours as needed for wheezing or shortness of breath., Disp: 1 Inhaler, Rfl: 0 .  azithromycin (ZITHROMAX) 250 MG tablet, Take 1 tablet (250 mg total) by mouth daily. 2 tabs po on day 1, 1 tab po on days 2-5, Disp: 6 tablet, Rfl: 0 .  dexamethasone (DECADRON) 4 MG tablet, 4 tabs (16 mg) po at once tomorrow, Disp: 4 tablet, Rfl: 0 .  ibuprofen (ADVIL,MOTRIN) 600 MG tablet, Take 1 tablet (600 mg total) by mouth every 6 (six) hours as needed., Disp: 30 tablet, Rfl: 0 .  Spacer/Aero-Holding Chambers (AEROCHAMBER PLUS) inhaler, Use as instructed, Disp: 1 each, Rfl: 2  Allergies  Allergen Reactions  . Amoxicillin Nausea And Vomiting     ROS  As noted in HPI.   Physical Exam  BP 110/69 (BP Location: Right Arm)   Pulse 79   Temp 98.9 F (37.2 C) (Oral)   Resp 18   SpO2 98%   Constitutional: Well developed, well nourished, no acute distress Eyes:  EOMI, conjunctiva normal bilaterally HENT: Normocephalic, atraumatic,mucus membranes moist Respiratory: Normal inspiratory effort Lungs clear bilaterally. Diffuse chest wall tenderness particularly on the left lower side in the midaxillary line Cardiovascular: Normal rate regular rhythm no murmurs rubs or gallops GI: nondistended skin: No rash, skin intact Musculoskeletal: no deformities no edema, calves symmetric, nontender Neurologic: Alert & oriented  x 3, no focal neuro deficits Psychiatric: Speech and behavior appropriate   ED Course   Medications  ipratropium-albuterol (DUONEB) 0.5-2.5 (3) MG/3ML nebulizer solution 3 mL (3 mLs Nebulization Given 08/22/16 1430)  dexamethasone (DECADRON) tablet 16 mg (16 mg Oral Given 08/22/16 1429)    Orders Placed This Encounter  Procedures  .  DG Chest 2 View    Standing Status:   Standing    Number of Occurrences:   1    Order Specific Question:   Reason for Exam (SYMPTOM  OR DIAGNOSIS REQUIRED)    Answer:   LLL pain, cough r/o PNA    No results found for this or any previous visit (from the past 24 hour(s)). Dg Chest 2 View  Result Date: 08/22/2016 CLINICAL DATA:  Cough and shortness of breath EXAM: CHEST  2 VIEW COMPARISON:  August 19, 2016 FINDINGS: Lungs are clear. Heart size and pulmonary vascularity are normal. No adenopathy. No bone lesions. IMPRESSION: No edema or consolidation. Electronically Signed   By: Bretta BangWilliam  Woodruff III M.D.   On: 08/22/2016 14:23    ED Clinical Impression  Bronchitis  Musculoskeletal chest pain  ED Assessment/Plan  Previous records, labs, EKG reviewed. As noted in history of present illness.  Doubt PE. [facility] meets the [facility] rule. She had normal workup 5 days ago, however, we'll repeat the chest x-ray. Primary concern is pneumonia today. Feel that her chest pain is most likely musculoskeletal or from a pneumonia and not from CHF or other cardiac cause. Giving duoneb, dexamethasone 16 mg by mouth, checking chest x-ray. We will reevaluate.  Reviewed imaging independently. No pneumonia, pulmonary edema, pneumothorax. See radiology report for details  Reexamination, patient states that she feels better. Plan to send home with one more day of dexamethasone 16 mg, albuterol with spacer 2 puffs every 4-6 hours, 600 milligrams ibuprofen with 1 g of Tylenol 3-4 times a day and azithromycin Z-Pak because she has had the symptoms for 3 weeks and is not getting any better despite no consolidation on x-ray. We'll provide a primary care referral for her to follow-up with as needed. She is to go to the ER if she gets worse.  Discussed labs, imaging, MDM, plan and followup with patient. Discussed sn/sx that should prompt return to the ED. Patient agrees with plan.   Meds ordered this encounter  Medications   . DISCONTD: ibuprofen (ADVIL,MOTRIN) 200 MG tablet    Sig: Take 200 mg by mouth every 6 (six) hours as needed.  . dimenhyDRINATE (DRAMAMINE) 50 MG tablet    Sig: Take 50 mg by mouth every 8 (eight) hours as needed.  Marland Kitchen. ipratropium-albuterol (DUONEB) 0.5-2.5 (3) MG/3ML nebulizer solution 3 mL  . dexamethasone (DECADRON) tablet 16 mg  . ibuprofen (ADVIL,MOTRIN) 600 MG tablet    Sig: Take 1 tablet (600 mg total) by mouth every 6 (six) hours as needed.    Dispense:  30 tablet    Refill:  0  . Spacer/Aero-Holding Chambers (AEROCHAMBER PLUS) inhaler    Sig: Use as instructed    Dispense:  1 each    Refill:  2  . albuterol (PROVENTIL HFA;VENTOLIN HFA) 108 (90 Base) MCG/ACT inhaler    Sig: Inhale 1-2 puffs into the lungs every 6 (six) hours as needed for wheezing or shortness of breath.    Dispense:  1 Inhaler    Refill:  0  . dexamethasone (DECADRON) 4 MG tablet    Sig: 4 tabs (16 mg) po at once tomorrow  Dispense:  4 tablet    Refill:  0  . azithromycin (ZITHROMAX) 250 MG tablet    Sig: Take 1 tablet (250 mg total) by mouth daily. 2 tabs po on day 1, 1 tab po on days 2-5    Dispense:  6 tablet    Refill:  0    *This clinic note was created using Scientist, clinical (histocompatibility and immunogenetics). Therefore, there may be occasional mistakes despite careful proofreading.  ?   Domenick Gong, MD 08/22/16 2110

## 2016-11-08 ENCOUNTER — Emergency Department (HOSPITAL_COMMUNITY)
Admission: EM | Admit: 2016-11-08 | Discharge: 2016-11-08 | Disposition: A | Discharge status: Home / Self Care | Payer: Self-pay | Attending: Emergency Medicine | Admitting: Emergency Medicine

## 2016-11-08 ENCOUNTER — Encounter (HOSPITAL_COMMUNITY): Payer: Self-pay | Admitting: Emergency Medicine

## 2016-11-08 ENCOUNTER — Emergency Department (HOSPITAL_COMMUNITY): Payer: Self-pay

## 2016-11-08 DIAGNOSIS — F17200 Nicotine dependence, unspecified, uncomplicated: Secondary | ICD-10-CM | POA: Insufficient documentation

## 2016-11-08 DIAGNOSIS — J45909 Unspecified asthma, uncomplicated: Secondary | ICD-10-CM | POA: Insufficient documentation

## 2016-11-08 DIAGNOSIS — Z79899 Other long term (current) drug therapy: Secondary | ICD-10-CM | POA: Insufficient documentation

## 2016-11-08 DIAGNOSIS — R0789 Other chest pain: Secondary | ICD-10-CM | POA: Insufficient documentation

## 2016-11-08 DIAGNOSIS — R109 Unspecified abdominal pain: Secondary | ICD-10-CM | POA: Insufficient documentation

## 2016-11-08 DIAGNOSIS — M25512 Pain in left shoulder: Secondary | ICD-10-CM | POA: Insufficient documentation

## 2016-11-08 LAB — URINALYSIS, ROUTINE W REFLEX MICROSCOPIC
BILIRUBIN URINE: NEGATIVE
Bilirubin Urine: NEGATIVE
Glucose, UA: NEGATIVE mg/dL
Glucose, UA: NEGATIVE mg/dL
HGB URINE DIPSTICK: NEGATIVE
Hgb urine dipstick: NEGATIVE
KETONES UR: NEGATIVE mg/dL
Ketones, ur: 5 mg/dL — AB
Leukocytes, UA: NEGATIVE
NITRITE: NEGATIVE
Nitrite: NEGATIVE
PROTEIN: 30 mg/dL — AB
Protein, ur: NEGATIVE mg/dL
SPECIFIC GRAVITY, URINE: 1.014 (ref 1.005–1.030)
Specific Gravity, Urine: 1.025 (ref 1.005–1.030)
pH: 6 (ref 5.0–8.0)
pH: 7 (ref 5.0–8.0)

## 2016-11-08 LAB — CBC
HEMATOCRIT: 38.6 % (ref 36.0–46.0)
Hemoglobin: 13 g/dL (ref 12.0–15.0)
MCH: 23.7 pg — ABNORMAL LOW (ref 26.0–34.0)
MCHC: 33.7 g/dL (ref 30.0–36.0)
MCV: 70.3 fL — ABNORMAL LOW (ref 78.0–100.0)
PLATELETS: 266 10*3/uL (ref 150–400)
RBC: 5.49 MIL/uL — ABNORMAL HIGH (ref 3.87–5.11)
RDW: 14 % (ref 11.5–15.5)
WBC: 7.1 10*3/uL (ref 4.0–10.5)

## 2016-11-08 LAB — BASIC METABOLIC PANEL
Anion gap: 7 (ref 5–15)
BUN: 6 mg/dL (ref 6–20)
CALCIUM: 9.5 mg/dL (ref 8.9–10.3)
CO2: 25 mmol/L (ref 22–32)
CREATININE: 0.72 mg/dL (ref 0.44–1.00)
Chloride: 106 mmol/L (ref 101–111)
GFR calc Af Amer: >60 mL/min (ref >60)
GLUCOSE: 90 mg/dL (ref 65–99)
POTASSIUM: 3.3 mmol/L — AB (ref 3.5–5.1)
Sodium: 138 mmol/L (ref 135–145)

## 2016-11-08 LAB — I-STAT TROPONIN, ED: Troponin i, poc: 0 ng/mL (ref 0.00–0.08)

## 2016-11-08 MED ORDER — CYCLOBENZAPRINE HCL 5 MG PO TABS: 5.0000 mg | ORAL_TABLET | Freq: Three times a day (TID) | ORAL | 0 refills | Status: DC | PRN

## 2016-11-08 MED ORDER — SODIUM CHLORIDE 0.9 % IV BOLUS (SEPSIS)
1000.0000 mL | Freq: Once | INTRAVENOUS | Status: AC
  Administered 2016-11-08: 1000 mL via INTRAVENOUS

## 2016-11-08 MED ORDER — KETOROLAC TROMETHAMINE 30 MG/ML IJ SOLN
30.0000 mg | Freq: Once | INTRAMUSCULAR | Status: AC
  Administered 2016-11-08: 30 mg via INTRAVENOUS
  Filled 2016-11-08: qty 1

## 2016-11-08 MED ORDER — NAPROXEN 500 MG PO TABS: 500.0000 mg | ORAL_TABLET | Freq: Two times a day (BID) | ORAL | 0 refills | Status: AC

## 2016-11-08 MED ORDER — NAPROXEN 500 MG PO TABS: 500.0000 mg | ORAL_TABLET | Freq: Two times a day (BID) | ORAL | 0 refills | Status: DC

## 2016-11-08 NOTE — ED Notes (Signed)
Pt departed in NAD, refused use of wheelchair.  

## 2016-11-08 NOTE — ED Triage Notes (Signed)
Pt. Stated, I have kidney infections and my left side is hurting and hard to pee . It startd today and I couldn't work.

## 2016-11-08 NOTE — ED Notes (Signed)
Pt reporting L chest pain/tingling beginning 0800 today, as well as flank pain.

## 2016-11-08 NOTE — ED Notes (Signed)
ED Provider at bedside. 

## 2016-11-08 NOTE — Discharge Instructions (Signed)
Suspect your back pain and chest pain is musculoskeletal in nature and related to muscle strain. I have prescribed anti-inflammatory medications and a muscle relaxer. You can also apply heat or ice to the area if it helps with pain.

## 2016-11-08 NOTE — ED Provider Notes (Signed)
[facility]   CSN: [id number] Arrival date & time: 11/08/16  1153     History   Chief Complaint Chief Complaint  Patient presents with  . Flank Pain  . Chest Pain    HPI Angela Cobb is a 26 y.o. female.  26 year old female with PMH of asthma, kidney stones, and PCOS who presents with left sided flank pain and chest pain. Reports that flank pain started earlier this week but significantly worsened today to the point that she had to leave work. Endorses history of kidney stones but has never seen a urologist or required intervention to pass stone. Reports pain is similar to prior pain with stones. Endorses some urinary frequency and trouble maintaining steady stream of urine. Denies dysuria, hematuria, incontinence, and urinary urgency. Denies vaginal discharge and pelvic pain.   Additionally, reports left sided "chest tightness" and pain that radiates into the left shoulder and upper arm. Pain started this morning. Pain worsens with movement of her left arm. Does not worsen with exertion or inspiration. Denies SOB or respiratory distress. 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       Past Medical History:  Diagnosis Date  . Asthma   . Kidney stone   . Polycystic ovarian syndrome   . Renal disorder     There are no active problems to display for this patient.   History reviewed. No pertinent surgical history.  OB History    No data available       Home Medications    Prior to Admission medications   Medication Sig Start Date End Date Taking? Authorizing Provider  Spacer/Aero-Holding Chambers (AEROCHAMBER PLUS) inhaler Use as instructed 08/22/16  Yes Domenick GongMortenson, Ashley, MD  albuterol (PROVENTIL HFA;VENTOLIN HFA) 108 (90 Base) MCG/ACT inhaler Inhale 1-2 puffs into the lungs every 6 (six) hours as needed for wheezing or shortness of breath. Patient not taking: Reported on 11/08/2016 08/22/16   Domenick GongMortenson,  Ashley, MD  azithromycin (ZITHROMAX) 250 MG tablet Take 1 tablet (250 mg total) by mouth daily. 2 tabs po on day 1, 1 tab po on days 2-5 Patient not taking: Reported on 11/08/2016 08/22/16   Domenick GongMortenson, Ashley, MD  cyclobenzaprine (FLEXERIL) 5 MG tablet Take 1 tablet (5 mg total) by mouth 3 (three) times daily as needed for muscle spasms. 11/08/16   Arvilla MarketWallace, Dorethia Jeanmarie Lauren, DO  dexamethasone (DECADRON) 4 MG tablet 4 tabs (16 mg) po at once tomorrow Patient not taking: Reported on 11/08/2016 08/22/16   Domenick GongMortenson, Ashley, MD  dimenhyDRINATE (DRAMAMINE) 50 MG tablet Take 50 mg by mouth every 8 (eight) hours as needed.    [provider]  ibuprofen (ADVIL,MOTRIN) 600 MG tablet Take 1 tablet (600 mg total) by mouth every 6 (six) hours as needed. Patient not taking: Reported on 11/08/2016 08/22/16   Domenick GongMortenson, Ashley, MD  naproxen (NAPROSYN) 500 MG tablet Take 1 tablet (500 mg total) by mouth 2 (two) times daily with a meal. 11/08/16 11/08/17  Arvilla MarketWallace, Sherine Cortese Lauren, DO    Family History No family history on file.  Social History Social History  Substance Use Topics  . Smoking status: Current Every Day Smoker  . Smokeless tobacco: Never Used  . Alcohol use No     Allergies   Amoxicillin   Review of Systems Review of Systems  Per HPI    Physical Exam Updated Vital Signs BP 112/71   Pulse 67   Temp  98.8 F (37.1 C) (Oral)   Resp (!) 23   Ht 5\' 6"  (1.676 m)   Wt 59 kg (130 lb)   LMP 09/08/2016   SpO2 100%   BMI 20.98 kg/m   Physical Exam  Constitutional: She is oriented to person, place, and time. She appears well-developed and well-nourished.  Appears uncomfortable 2/2 to pain but non-toxic appearing.   HENT:  Head: Normocephalic and atraumatic.  Eyes: Conjunctivae and EOM are normal.  Cardiovascular: Normal rate, regular rhythm and normal heart sounds.   No murmur heard. Left lateral chest wall is TTP.   Pulmonary/Chest: Effort normal and breath sounds normal. No  respiratory distress. She has no wheezes.  Abdominal: Soft. Bowel sounds are normal. She exhibits no distension. There is no tenderness. There is no rebound and no guarding.  Left sided flank pain and CVA tenderness.   Musculoskeletal:  TTP of the left shoulder joint without edema, erythema, or joint abnormality.   Neurological: She is alert and oriented to person, place, and time.  Skin: Skin is warm and dry.  Psychiatric: She has a normal mood and affect.     ED Treatments / Results  Labs (all labs ordered are listed, but only abnormal results are displayed) Labs Reviewed  URINALYSIS, ROUTINE W REFLEX MICROSCOPIC - Abnormal; Notable for the following:       Result Value   Color, Urine AMBER (*)    APPearance CLOUDY (*)    Protein, ur 30 (*)    Leukocytes, UA MODERATE (*)    Bacteria, UA MANY (*)    Squamous Epithelial / LPF 6-30 (*)    All other components within normal limits  BASIC METABOLIC PANEL - Abnormal; Notable for the following:    Potassium 3.3 (*)    All other components within normal limits  CBC - Abnormal; Notable for the following:    RBC 5.49 (*)    MCV 70.3 (*)    MCH 23.7 (*)    All other components within normal limits  URINALYSIS, ROUTINE W REFLEX MICROSCOPIC - Abnormal; Notable for the following:    Ketones, ur 5 (*)    All other components within normal limits  POC URINE PREG, ED  I-STAT TROPOININ, ED    EKG  EKG Interpretation None       Radiology Dg Chest 2 View  Result Date: 11/08/2016 CLINICAL DATA:  Left chest pain for 1 week. EXAM: CHEST  2 VIEW COMPARISON:  08/22/2016 and prior radiographs FINDINGS: The cardiomediastinal silhouette is unremarkable. There is no evidence of focal airspace disease, pulmonary edema, suspicious pulmonary nodule/mass, pleural effusion, or pneumothorax. No acute bony abnormalities are identified. IMPRESSION: No active cardiopulmonary disease. Electronically Signed   By: Harmon Pier M.D.   On: 11/08/2016 13:04     Procedures Procedures (including critical care time)  Medications Ordered in ED Medications  ketorolac (TORADOL) 30 MG/ML injection 30 mg (30 mg Intravenous Given 11/08/16 1310)  sodium chloride 0.9 % bolus 1,000 mL (0 mLs Intravenous Stopped 11/08/16 1421)     Initial Impression / Assessment and Plan / ED Course  I have reviewed the triage vital signs and the nursing notes.  Pertinent labs & imaging results that were available during my care of the patient were reviewed by me and considered in my medical decision making (see chart for details).     Patient with PMH of renal stones presenting with flank pain similar to prior pain. VSS. Exam significant for left sided CVA tenderness.  Initial UA without HgB or RBCs, so less likely that renal stone causing symptoms. UA questionable for infectious process and with squamous cells, so will re-collect. Pain mediation and IVFs given.   Suspect chest pain musculoskeletal in nature as it worsens with joint movement and is reproducible on exam. EKG NSR, CXR negative and troponin negative.   Repeat UA normal without blood or sign of infectious process. Suspect flank pain musculoskeletal. Have given Rx for NSAID and muscle relaxer. Patient to establish with PCP. Return precautions discussed.   Final Clinical Impressions(s) / ED Diagnoses   Final diagnoses:  Chest wall pain  Flank pain    New Prescriptions New Prescriptions   CYCLOBENZAPRINE (FLEXERIL) 5 MG TABLET    Take 1 tablet (5 mg total) by mouth 3 (three) times daily as needed for muscle spasms.   NAPROXEN (NAPROSYN) 500 MG TABLET    Take 1 tablet (500 mg total) by mouth 2 (two) times daily with a meal.     Arvilla Market, DO 11/08/16 1453    Rolan Bucco, MD 11/08/16 [phone]

## 2016-11-08 NOTE — ED Notes (Signed)
Patient transported to X-ray 

## 2017-09-07 ENCOUNTER — Other Ambulatory Visit: Payer: Self-pay

## 2017-09-07 ENCOUNTER — Emergency Department (HOSPITAL_BASED_OUTPATIENT_CLINIC_OR_DEPARTMENT_OTHER): Payer: Self-pay

## 2017-09-07 ENCOUNTER — Emergency Department (HOSPITAL_BASED_OUTPATIENT_CLINIC_OR_DEPARTMENT_OTHER)
Admission: EM | Admit: 2017-09-07 | Discharge: 2017-09-07 | Disposition: A | Discharge status: Home / Self Care | Payer: Self-pay | Attending: Emergency Medicine | Admitting: Emergency Medicine

## 2017-09-07 ENCOUNTER — Encounter (HOSPITAL_BASED_OUTPATIENT_CLINIC_OR_DEPARTMENT_OTHER): Payer: Self-pay

## 2017-09-07 DIAGNOSIS — J1100 Influenza due to unidentified influenza virus with unspecified type of pneumonia: Secondary | ICD-10-CM | POA: Insufficient documentation

## 2017-09-07 DIAGNOSIS — R6889 Other general symptoms and signs: Secondary | ICD-10-CM

## 2017-09-07 DIAGNOSIS — Z79899 Other long term (current) drug therapy: Secondary | ICD-10-CM | POA: Insufficient documentation

## 2017-09-07 DIAGNOSIS — R112 Nausea with vomiting, unspecified: Secondary | ICD-10-CM | POA: Insufficient documentation

## 2017-09-07 DIAGNOSIS — J181 Lobar pneumonia, unspecified organism: Secondary | ICD-10-CM

## 2017-09-07 DIAGNOSIS — F17210 Nicotine dependence, cigarettes, uncomplicated: Secondary | ICD-10-CM | POA: Insufficient documentation

## 2017-09-07 DIAGNOSIS — J189 Pneumonia, unspecified organism: Secondary | ICD-10-CM

## 2017-09-07 DIAGNOSIS — J45909 Unspecified asthma, uncomplicated: Secondary | ICD-10-CM | POA: Insufficient documentation

## 2017-09-07 HISTORY — DX: Anemia, unspecified: D64.9

## 2017-09-07 LAB — COMPREHENSIVE METABOLIC PANEL
ALT: 15 U/L (ref 14–54)
AST: 24 U/L (ref 15–41)
Albumin: 4.1 g/dL (ref 3.5–5.0)
Alkaline Phosphatase: 51 U/L (ref 38–126)
Anion gap: 9 (ref 5–15)
BUN: 8 mg/dL (ref 6–20)
CHLORIDE: 105 mmol/L (ref 101–111)
CO2: 22 mmol/L (ref 22–32)
CREATININE: 0.65 mg/dL (ref 0.44–1.00)
Calcium: 8.4 mg/dL — ABNORMAL LOW (ref 8.9–10.3)
GFR calc Af Amer: >60 mL/min (ref >60)
Glucose, Bld: 104 mg/dL — ABNORMAL HIGH (ref 65–99)
Potassium: 3.4 mmol/L — ABNORMAL LOW (ref 3.5–5.1)
Sodium: 136 mmol/L (ref 135–145)
Total Bilirubin: 0.8 mg/dL (ref 0.3–1.2)
Total Protein: 7.3 g/dL (ref 6.5–8.1)

## 2017-09-07 LAB — CBC WITH DIFFERENTIAL/PLATELET
Basophils Absolute: 0 10*3/uL (ref 0.0–0.1)
Basophils Relative: 0 %
EOS PCT: 1 %
Eosinophils Absolute: 0.1 10*3/uL (ref 0.0–0.7)
HEMATOCRIT: 34.9 % — AB (ref 36.0–46.0)
HEMOGLOBIN: 11.8 g/dL — AB (ref 12.0–15.0)
LYMPHS PCT: 12 %
Lymphs Abs: 0.6 10*3/uL — ABNORMAL LOW (ref 0.7–4.0)
MCH: 23.2 pg — ABNORMAL LOW (ref 26.0–34.0)
MCHC: 33.8 g/dL (ref 30.0–36.0)
MCV: 68.7 fL — AB (ref 78.0–100.0)
MONO ABS: 0.3 10*3/uL (ref 0.1–1.0)
MONOS PCT: 7 %
NEUTROS PCT: 80 %
Neutro Abs: 3.9 10*3/uL (ref 1.7–7.7)
PLATELETS: 220 10*3/uL (ref 150–400)
RBC: 5.08 MIL/uL (ref 3.87–5.11)
RDW: 13.9 % (ref 11.5–15.5)
WBC: 4.9 10*3/uL (ref 4.0–10.5)

## 2017-09-07 LAB — I-STAT CG4 LACTIC ACID, ED: Lactic Acid, Venous: 0.85 mmol/L (ref 0.5–1.9)

## 2017-09-07 LAB — URINALYSIS, ROUTINE W REFLEX MICROSCOPIC
Bilirubin Urine: NEGATIVE
GLUCOSE, UA: NEGATIVE mg/dL
HGB URINE DIPSTICK: NEGATIVE
Ketones, ur: NEGATIVE mg/dL
Leukocytes, UA: NEGATIVE
Nitrite: NEGATIVE
PROTEIN: NEGATIVE mg/dL
Specific Gravity, Urine: 1.015 (ref 1.005–1.030)
pH: 8.5 — ABNORMAL HIGH (ref 5.0–8.0)

## 2017-09-07 LAB — PREGNANCY, URINE: PREG TEST UR: NEGATIVE

## 2017-09-07 LAB — LIPASE, BLOOD: Lipase: 24 U/L (ref 11–51)

## 2017-09-07 MED ORDER — HYDROCOD POLST-CPM POLST ER 10-8 MG/5ML PO SUER
5.0000 mL | Freq: Once | ORAL | Status: AC
  Administered 2017-09-07: 5 mL via ORAL
  Filled 2017-09-07: qty 5

## 2017-09-07 MED ORDER — BENZONATATE 100 MG PO CAPS: 100.0000 mg | ORAL_CAPSULE | Freq: Three times a day (TID) | ORAL | 0 refills | Status: DC

## 2017-09-07 MED ORDER — IPRATROPIUM-ALBUTEROL 0.5-2.5 (3) MG/3ML IN SOLN
3.0000 mL | Freq: Four times a day (QID) | RESPIRATORY_TRACT | Status: DC
  Administered 2017-09-07: 3 mL via RESPIRATORY_TRACT
  Filled 2017-09-07: qty 3

## 2017-09-07 MED ORDER — ACETAMINOPHEN 325 MG PO TABS
650.0000 mg | ORAL_TABLET | Freq: Once | ORAL | Status: AC
  Administered 2017-09-07: 650 mg via ORAL
  Filled 2017-09-07: qty 2

## 2017-09-07 MED ORDER — ALBUTEROL SULFATE HFA 108 (90 BASE) MCG/ACT IN AERS
2.0000 | INHALATION_SPRAY | Freq: Once | RESPIRATORY_TRACT | Status: AC
  Administered 2017-09-07: 2 via RESPIRATORY_TRACT
  Filled 2017-09-07: qty 6.7

## 2017-09-07 MED ORDER — SODIUM CHLORIDE 0.9 % IV BOLUS
1000.0000 mL | Freq: Once | INTRAVENOUS | Status: AC
  Administered 2017-09-07: 1000 mL via INTRAVENOUS

## 2017-09-07 MED ORDER — AZITHROMYCIN 250 MG PO TABS: 250.0000 mg | ORAL_TABLET | Freq: Every day | ORAL | 0 refills | Status: DC

## 2017-09-07 MED ORDER — AZITHROMYCIN 250 MG PO TABS
500.0000 mg | ORAL_TABLET | Freq: Once | ORAL | Status: AC
  Administered 2017-09-07: 500 mg via ORAL
  Filled 2017-09-07: qty 2

## 2017-09-07 NOTE — ED Provider Notes (Signed)
[facility] EMERGENCY DEPARTMENT Provider Note   CSN: [id number] Arrival date & time: 09/07/17  1712     History   Chief Complaint Chief Complaint  Patient presents with  . Fever    HPI Angela ConnSierra K Gabrielle is a 27 y.o. female.  HPI Angela Cobb is a 27 y.o. female presents to emergency department with complaint of nausea, vomiting, body aches, cough, congestion, fever.  Patient states that she has had symptoms for 4 days.  She states she feels like nausea and vomiting is improving but cough is getting worse.  She reports pain to the left lungs and left upper abdomen.  She states she cannot keep anything down.  She states that she was evaluated by EMS 3 days ago who told her that she should go to urgent care, but states patient did not go.  She states that she is coughing up brown sputum.  She also admits that she is having trouble breathing.  No medications taken prior to coming in.  Past Medical History:  Diagnosis Date  . Anemia   . Asthma   . Kidney stone   . Polycystic ovarian syndrome   . Renal disorder     There are no active problems to display for this patient.   History reviewed. No pertinent surgical history.   OB History   None      Home Medications    Prior to Admission medications   Medication Sig Start Date End Date Taking? Authorizing Provider  albuterol (PROVENTIL HFA;VENTOLIN HFA) 108 (90 Base) MCG/ACT inhaler Inhale 1-2 puffs into the lungs every 6 (six) hours as needed for wheezing or shortness of breath. Patient not taking: Reported on 11/08/2016 08/22/16   Domenick GongMortenson, Ashley, MD  azithromycin (ZITHROMAX) 250 MG tablet Take 1 tablet (250 mg total) by mouth daily. 2 tabs po on day 1, 1 tab po on days 2-5 Patient not taking: Reported on 11/08/2016 08/22/16   Domenick GongMortenson, Ashley, MD  cyclobenzaprine (FLEXERIL) 5 MG tablet Take 1 tablet (5 mg total) by mouth 3 (three) times daily as needed for muscle spasms. 11/08/16   Arvilla MarketWallace, Catherine Lauren, DO    dexamethasone (DECADRON) 4 MG tablet 4 tabs (16 mg) po at once tomorrow Patient not taking: Reported on 11/08/2016 08/22/16   Domenick GongMortenson, Ashley, MD  dimenhyDRINATE (DRAMAMINE) 50 MG tablet Take 50 mg by mouth every 8 (eight) hours as needed.    [provider]  ibuprofen (ADVIL,MOTRIN) 600 MG tablet Take 1 tablet (600 mg total) by mouth every 6 (six) hours as needed. Patient not taking: Reported on 11/08/2016 08/22/16   Domenick GongMortenson, Ashley, MD  naproxen (NAPROSYN) 500 MG tablet Take 1 tablet (500 mg total) by mouth 2 (two) times daily with a meal. 11/08/16 11/08/17  Arvilla MarketWallace, Catherine Lauren, DO  Spacer/Aero-Holding Chambers (AEROCHAMBER PLUS) inhaler Use as instructed 08/22/16   Domenick GongMortenson, Ashley, MD    Family History No family history on file.  Social History Social History   Tobacco Use  . Smoking status: Current Every Day Smoker  . Smokeless tobacco: Never Used  Substance Use Topics  . Alcohol use: No  . Drug use: No     Allergies   Amoxicillin   Review of Systems Review of Systems  Constitutional: Positive for chills and fever.  HENT: Positive for congestion.   Respiratory: Positive for cough and shortness of breath. Negative for chest tightness.   Cardiovascular: Negative for chest pain, palpitations and leg swelling.  Gastrointestinal: Positive for abdominal pain,  diarrhea, nausea and vomiting.  Genitourinary: Negative for difficulty urinating, dysuria, flank pain, frequency, pelvic pain, vaginal bleeding, vaginal discharge and vaginal pain.  Musculoskeletal: Negative for arthralgias, myalgias, neck pain and neck stiffness.  Skin: Negative for rash.  Neurological: Positive for headaches. Negative for dizziness and weakness.  All other systems reviewed and are negative.    Physical Exam Updated Vital Signs BP (!) 147/77 (BP Location: Right Arm)   Pulse 85   Temp 99.9 F (37.7 C) (Oral)   Resp 20   Ht 5\' 6"  (1.676 m)   Wt 57.6 kg (127 lb)   SpO2 100%   BMI  20.50 kg/m   Physical Exam  Constitutional: She is oriented to person, place, and time. She appears well-developed and well-nourished. No distress.  HENT:  Head: Normocephalic and atraumatic.  Right Ear: Tympanic membrane, external ear and ear canal normal.  Left Ear: Tympanic membrane, external ear and ear canal normal.  Nose: Mucosal edema and rhinorrhea present.  Mouth/Throat: Uvula is midline and mucous membranes are normal. Posterior oropharyngeal erythema present. No oropharyngeal exudate, posterior oropharyngeal edema or tonsillar abscesses.  Eyes: Conjunctivae are normal.  Neck: Neck supple.  Cardiovascular: Normal rate, regular rhythm, normal heart sounds and intact distal pulses.  Pulmonary/Chest: Effort normal. No respiratory distress. She has no wheezes. She has rales.  Rales at left base  Abdominal: She exhibits no distension. There is tenderness. There is no rebound and no guarding.  Diffuse tenderness  Musculoskeletal: Normal range of motion.  Neurological: She is alert and oriented to person, place, and time.  Skin: Skin is warm and dry.  Psychiatric: She has a normal mood and affect.  Nursing note and vitals reviewed.    ED Treatments / Results  Labs (all labs ordered are listed, but only abnormal results are displayed) Labs Reviewed  CBC WITH DIFFERENTIAL/PLATELET - Abnormal; Notable for the following components:      Result Value   Hemoglobin 11.8 (*)    HCT 34.9 (*)    MCV 68.7 (*)    MCH 23.2 (*)    Lymphs Abs 0.6 (*)    All other components within normal limits  COMPREHENSIVE METABOLIC PANEL - Abnormal; Notable for the following components:   Potassium 3.4 (*)    Glucose, Bld 104 (*)    Calcium 8.4 (*)    All other components within normal limits  URINALYSIS, ROUTINE W REFLEX MICROSCOPIC - Abnormal; Notable for the following components:   pH 8.5 (*)    All other components within normal limits  PREGNANCY, URINE  LIPASE, BLOOD  I-STAT CG4 LACTIC  ACID, ED  I-STAT CG4 LACTIC ACID, ED    EKG None  Radiology Dg Chest 2 View  Result Date: 09/07/2017 CLINICAL DATA:  Body aches and fevers since Saturday. EXAM: CHEST - 2 VIEW COMPARISON:  11/08/2016; 08/19/2016; 01/18/2011 FINDINGS: Grossly unchanged cardiac silhouette and mediastinal contours. Left lower lung/retrocardiac consolidative airspace opacity. No pleural effusion or pneumothorax. No evidence of edema. No acute osseus abnormalities. IMPRESSION: Findings worrisome for left lower lobe pneumonia. Electronically Signed   By: Simonne Come M.D.   On: 09/07/2017 18:13    Procedures Procedures (including critical care time)  Medications Ordered in ED Medications  acetaminophen (TYLENOL) tablet 650 mg (650 mg Oral Given 09/07/17 1733)     Initial Impression / Assessment and Plan / ED Course  I have reviewed the triage vital signs and the nursing notes.  Pertinent labs & imaging results that were available during  my care of the patient were reviewed by me and considered in my medical decision making (see chart for details).    Patient with flulike symptoms for about 4 days, states vomiting and diarrhea improving, however cough seems to be getting worse.  Continues to have fevers.  Patient is mildly tachycardic here, febrile.  Will give Tylenol, IV fluids, check labs, check chest x-ray.  Patient does have some rales at bases, specifically at the left, suspect possible pneumonia.  Patient's chest x-ray did show pneumonia.  She is feeling much better.  Her vital signs are normal at this time on the monitor.  Her lactic acid and WBC are normal.  She is nontoxic.  I do not think she is septic.  Will treat with oral antibiotics.  Will do a p.o. challenge prior to discharge.  Patient is feeling better, she is drinking fluids.  Family at bedside.  They will help to take care of her at home.  Will discharge home with inhaler, cough medications, Z-Pak.  Follow-up with family doctor, and return  precautions discussed.  Vitals:   09/07/17 1927 09/07/17 1943 09/07/17 2106 09/07/17 2112  BP:  136/78  124/64  Pulse:  (!) 102  (!) 105  Resp:  18  16  Temp:  100.3 F (37.9 C)  100.2 F (37.9 C)  TempSrc:  Oral  Oral  SpO2: 99% 100% 98% 100%  Weight:      Height:        Final Clinical Impressions(s) / ED Diagnoses   Final diagnoses:  Community acquired pneumonia of left lower lobe of lung (HCC)  Flu-like symptoms    ED Discharge Orders        Ordered    benzonatate (TESSALON) 100 MG capsule  Every 8 hours     09/07/17 2102    azithromycin (ZITHROMAX) 250 MG tablet  Daily     09/07/17 2102       Jaynie Crumble, PA-C 09/07/17 2214    Maia Plan, MD 09/08/17 1627

## 2017-09-07 NOTE — ED Triage Notes (Signed)
Pt has been sick since Thursday with n/v, started having fever and body aches Saturday, received 500ml saline and 4mg  zofran en route by EMS

## 2017-09-07 NOTE — Discharge Instructions (Addendum)
Take tylenol or motrin for fever and body aches. Drink plenty of fluids. Zithromax as prescribed until all gone. Use inhaler 2 puffs every 4 hrs. Tessalon for cough. Follow up with family doctor. Return if worsening.

## 2018-05-06 ENCOUNTER — Emergency Department (HOSPITAL_COMMUNITY)
Admission: EM | Admit: 2018-05-06 | Discharge: 2018-05-06 | Disposition: A | Discharge status: Home / Self Care | Payer: Self-pay | Attending: Emergency Medicine | Admitting: Emergency Medicine

## 2018-05-06 ENCOUNTER — Other Ambulatory Visit: Payer: Self-pay

## 2018-05-06 ENCOUNTER — Encounter (HOSPITAL_COMMUNITY): Payer: Self-pay

## 2018-05-06 DIAGNOSIS — Z79899 Other long term (current) drug therapy: Secondary | ICD-10-CM | POA: Insufficient documentation

## 2018-05-06 DIAGNOSIS — J45909 Unspecified asthma, uncomplicated: Secondary | ICD-10-CM | POA: Insufficient documentation

## 2018-05-06 DIAGNOSIS — F17200 Nicotine dependence, unspecified, uncomplicated: Secondary | ICD-10-CM | POA: Insufficient documentation

## 2018-05-06 DIAGNOSIS — W228XXA Striking against or struck by other objects, initial encounter: Secondary | ICD-10-CM | POA: Insufficient documentation

## 2018-05-06 DIAGNOSIS — Y990 Civilian activity done for income or pay: Secondary | ICD-10-CM | POA: Insufficient documentation

## 2018-05-06 DIAGNOSIS — K0889 Other specified disorders of teeth and supporting structures: Secondary | ICD-10-CM | POA: Insufficient documentation

## 2018-05-06 DIAGNOSIS — Y9289 Other specified places as the place of occurrence of the external cause: Secondary | ICD-10-CM | POA: Insufficient documentation

## 2018-05-06 DIAGNOSIS — Y939 Activity, unspecified: Secondary | ICD-10-CM | POA: Insufficient documentation

## 2018-05-06 MED ORDER — BUPIVACAINE-EPINEPHRINE (PF) 0.5% -1:200000 IJ SOLN
1.8000 mL | Freq: Once | INTRAMUSCULAR | Status: AC
  Administered 2018-05-06: 1.8 mL
  Filled 2018-05-06: qty 1.8

## 2018-05-06 MED ORDER — LIDOCAINE VISCOUS HCL 2 % MT SOLN: 15.0000 mL | OROMUCOSAL | 2 refills | Status: DC | PRN

## 2018-05-06 MED ORDER — IBUPROFEN 200 MG PO TABS
600.0000 mg | ORAL_TABLET | Freq: Once | ORAL | Status: AC
  Administered 2018-05-06: 600 mg via ORAL
  Filled 2018-05-06: qty 3
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
## 2018-05-06 NOTE — ED Triage Notes (Signed)
EMS reports left side upper tooth and jam pain.  BP 128/74 HR 84 RR 18

## 2018-05-06 NOTE — Discharge Instructions (Addendum)
°  Dental Pain °You have been seen today for dental pain. You should follow up with a dentist as soon as possible. This problem will not resolve on its own without the care of a dentist. Use ibuprofen or naproxen for pain. Use the viscous lidocaine for mouth pain. Swish with the lidocaine and spit it out. Do not swallow it. °You should also swish with a homemade salt water solution, twice a day.  Make this solution by mixing 8 ounces of warm water with about half a teaspoon of salt. °Antiinflammatory medications: Take 600 mg of ibuprofen every 6 hours or 440 mg (over the counter dose) to 500 mg (prescription dose) of naproxen every 12 hours for the next 3 days. After this time, these medications may be used as needed for pain. Take these medications with food to avoid upset stomach. Choose only one of these medications, do not take them together. °Acetaminophen (generic for Tylenol): Should you continue to have additional pain while taking the ibuprofen or naproxen, you may add in acetaminophen as needed. Your daily total maximum amount of acetaminophen from all sources should be limited to 4000mg/day for persons without liver problems, or 2000mg/day for those with liver problems. ° °For prescription assistance, may try using prescription discount sites or apps, such as goodrx.com ° °Dental Resource Guide ° °[facility] °[address], [address] °[place], [place] [address] °[phone] ° °[facility] °[address] °[place], [place] [address] °[phone] ° °[facility] °[address] °[place], [place] [address] °[phone]. [phone] ° °[facility] °[address] °[place], [place] [address] °[phone] ° °[facility] °[address] °[place], [place] [address] °[phone] ° °[employer] °[facility]ww.[facility].[facility]/patientcare/studentclinics/becomepatient ° °[employer] [facility] °[address] °[place], [place] [address] °[phone] ° °Website for free, low-income, or sliding scale dental services in [place]: °www.freedentalcare.us ° °To find a dentist in [place] and surrounding areas: °www.ncdental.org/for-the-public/find-a-dentist ° °[facility] °http://www.ncdental.org/meetings-events/[place] ° °[place] [facility] °https://dma.ncdhhs.gov/find-a-doctor/medicaid-dental-providers ° °

## 2018-05-06 NOTE — ED Notes (Signed)
Bed: WA02 Expected date: 05/06/18 Expected time: 8:42 AM Means of arrival: Ambulance Comments: EMS/Dental issue

## 2019-01-13 ENCOUNTER — Ambulatory Visit: Payer: Self-pay | Admitting: Family Medicine

## 2019-03-16 ENCOUNTER — Other Ambulatory Visit: Payer: Self-pay

## 2019-03-16 DIAGNOSIS — Z20822 Contact with and (suspected) exposure to covid-19: Secondary | ICD-10-CM

## 2019-03-17 LAB — NOVEL CORONAVIRUS, NAA: SARS-CoV-2, NAA: NOT DETECTED

## 2020-12-04 ENCOUNTER — Other Ambulatory Visit: Payer: Self-pay

## 2020-12-04 ENCOUNTER — Ambulatory Visit: Payer: Medicaid Other | Admitting: Physician Assistant

## 2020-12-04 VITALS — BP 133/76 | HR 77 | Temp 98.7°F | Resp 18 | Ht 66.0 in | Wt 120.0 lb

## 2020-12-04 DIAGNOSIS — R7989 Other specified abnormal findings of blood chemistry: Secondary | ICD-10-CM

## 2020-12-04 DIAGNOSIS — E876 Hypokalemia: Secondary | ICD-10-CM

## 2020-12-04 DIAGNOSIS — F5104 Psychophysiologic insomnia: Secondary | ICD-10-CM

## 2020-12-04 DIAGNOSIS — Z114 Encounter for screening for human immunodeficiency virus [HIV]: Secondary | ICD-10-CM

## 2020-12-04 DIAGNOSIS — Z1159 Encounter for screening for other viral diseases: Secondary | ICD-10-CM

## 2020-12-04 DIAGNOSIS — D573 Sickle-cell trait: Secondary | ICD-10-CM

## 2020-12-04 DIAGNOSIS — R21 Rash and other nonspecific skin eruption: Secondary | ICD-10-CM

## 2020-12-04 DIAGNOSIS — M546 Pain in thoracic spine: Secondary | ICD-10-CM | POA: Diagnosis not present

## 2020-12-04 DIAGNOSIS — F4310 Post-traumatic stress disorder, unspecified: Secondary | ICD-10-CM | POA: Diagnosis not present

## 2020-12-04 DIAGNOSIS — Z0000 Encounter for general adult medical examination without abnormal findings: Secondary | ICD-10-CM

## 2020-12-04 LAB — POCT URINALYSIS DIP (CLINITEK)
Bilirubin, UA: NEGATIVE
Blood, UA: NEGATIVE
Glucose, UA: NEGATIVE mg/dL
Ketones, POC UA: NEGATIVE mg/dL
Nitrite, UA: NEGATIVE
POC PROTEIN,UA: NEGATIVE
Spec Grav, UA: 1.025 (ref 1.010–1.025)
Urobilinogen, UA: 1 E.U./dL
pH, UA: 6 (ref 5.0–8.0)

## 2020-12-04 MED ORDER — DICLOFENAC SODIUM 1 % EX GEL: 4.0000 g | Freq: Four times a day (QID) | CUTANEOUS | 1 refills | Status: DC

## 2020-12-04 MED ORDER — CYCLOBENZAPRINE HCL 10 MG PO TABS: 10.0000 mg | ORAL_TABLET | Freq: Three times a day (TID) | ORAL | 0 refills | Status: AC | PRN

## 2020-12-04 MED ORDER — CETIRIZINE HCL 10 MG PO TABS: 10.0000 mg | ORAL_TABLET | Freq: Every day | ORAL | 11 refills | Status: DC

## 2020-12-04 NOTE — Progress Notes (Signed)
New Patient Office Visit  Subjective:  Patient ID: Angela Cobb, female    DOB: 07-Dec-1990  Age: 30 y.o. MRN: [id number]  CC:  Chief Complaint  Patient presents with   Annual Exam     HPI Angela Cobb reports she has been having bilateral mid back pain for the past couple of weeks.  Describes it as tight, pulling internally.  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  Reports that she has tried Tylenol and ibuprofen without much relief.  States that she does not drink much water, has been drinking more sodas lately.   Reports that she has been having tightness in her chest and congestion states that this has been present for "a long time".  States it is worse when it is hot outside.  States that she has been using a humidifier with little relief.  Denies shortness of breath.  Reports that she has been getting what she believes is hives on both arms.  States she started giving them in the summertime, and only for the last 2 years since she started her new job.  Is unsure if she is allergic to something.  Reports that they are itchy when they appear.  States that she is not currently having any hives.  Reports that she has been having difficulty staying asleep, states that she wakes several times throughout the night with racing thoughts.  Has not tried anything for relief.  Does endorse a history of PTSD.    Past Medical History:  Diagnosis Date   Anemia    Asthma    Kidney stone    Polycystic ovarian syndrome    Renal disorder     No past surgical history on file.  No family history on file.  Social History   Socioeconomic History   Marital status: Single    Spouse name: Not on file   Number of children: Not on file   Years of education: Not on file   Highest education level: Not on file  Occupational History   Not on file  Tobacco Use   Smoking status: Every Day     Pack years: 0.00   Smokeless tobacco: Never  Substance and Sexual Activity   Alcohol use: No   Drug use: No   Sexual activity: Yes    Birth control/protection: Condom, None  Other Topics Concern   Not on file  Social History Narrative   Not on file   Social Determinants of Health   Financial Resource Strain: Not on file  Food Insecurity: Not on file  Transportation Needs: Not on file  Physical Activity: Not on file  Stress: Not on file  Social Connections: Not on file  Intimate Partner Violence: Not on file    ROS Review of Systems  Constitutional:  Negative for chills, fatigue and fever.  HENT:  Positive for congestion. Negative for ear pain, postnasal drip, sinus pressure and sore throat.   Eyes: Negative.   Respiratory:  Positive for cough and chest tightness. Negative for shortness of breath and wheezing.   Cardiovascular:  Negative for chest pain.  Gastrointestinal: Negative.   Endocrine: Negative.   Genitourinary:  Negative for dysuria and vaginal discharge.  Musculoskeletal:  Positive for arthralgias and back pain.  Skin: Negative.   Allergic/Immunologic: Negative.   Neurological: Negative.   Hematological: Negative.   Psychiatric/Behavioral:  Positive for sleep disturbance.  Negative for dysphoric mood, self-injury and suicidal ideas. The patient is nervous/anxious.    Objective:   Today's Vitals: BP 133/76 (BP Location: Left Arm, Patient Position: Sitting, Cuff Size: Normal)   Pulse 77   Temp 98.7 F (37.1 C) (Oral)   Resp 18   Ht 5\' 6"  (1.676 m)   Wt 120 lb (54.4 kg)   SpO2 100%   BMI 19.37 kg/m   Physical Exam Vitals and nursing note reviewed.  Constitutional:      Appearance: Normal appearance.  HENT:     Head: Normocephalic and atraumatic.     Right Ear: External ear normal.     Left Ear: External ear normal.     Nose: Nose normal.     Mouth/Throat:     Mouth: Mucous membranes are moist.     Pharynx: Oropharynx is clear.  Eyes:      Extraocular Movements: Extraocular movements intact.     Conjunctiva/sclera: Conjunctivae normal.     Pupils: Pupils are equal, round, and reactive to light.  Cardiovascular:     Rate and Rhythm: Normal rate and regular rhythm.     Pulses: Normal pulses.     Heart sounds: Normal heart sounds.  Pulmonary:     Effort: Pulmonary effort is normal.     Breath sounds: Normal breath sounds.  Musculoskeletal:        General: Normal range of motion.     Cervical back: Normal, normal range of motion and neck supple.     Thoracic back: Tenderness present.     Lumbar back: Normal.  Skin:    General: Skin is warm and dry.       Neurological:     General: No focal deficit present.     Mental Status: She is alert and oriented to person, place, and time.  Psychiatric:        Mood and Affect: Mood normal.        Behavior: Behavior normal.        Thought Content: Thought content normal.        Judgment: Judgment normal.    Assessment & Plan:   Problem List Items Addressed This Visit   None Visit Diagnoses     Encounter for HCV screening test for low risk patient    -  Primary   Relevant Orders   HCV Ab w Reflex to Quant PCR (Completed)   Wellness examination       Relevant Orders   CBC with Differential/Platelet (Completed)   Comp. Metabolic Panel (12) (Completed)   TSH (Completed)   Acute bilateral thoracic back pain       Relevant Medications   cyclobenzaprine (FLEXERIL) 10 MG tablet   diclofenac Sodium (VOLTAREN) 1 % GEL   Other Relevant Orders   Urine Culture   POCT URINALYSIS DIP (CLINITEK) (Completed)   Rash and nonspecific skin eruption       Relevant Medications   cetirizine (ZYRTEC) 10 MG tablet   Other Relevant Orders   Ambulatory referral to Allergy   Psychophysiological insomnia       PTSD (post-traumatic stress disorder)       Relevant Orders   Ambulatory referral to Social Work   Hypokalemia       Sickle cell trait (HCC)       Screening for HIV without  presence of risk factors       Relevant Orders   HIV antibody (with reflex) (Completed)       Outpatient  Encounter Medications as of 12/04/2020  Medication Sig   cetirizine (ZYRTEC) 10 MG tablet Take 1 tablet (10 mg total) by mouth daily.   cyclobenzaprine (FLEXERIL) 10 MG tablet Take 1 tablet (10 mg total) by mouth 3 (three) times daily as needed for muscle spasms.   diclofenac Sodium (VOLTAREN) 1 % GEL Apply 4 g topically 4 (four) times daily.   Spacer/Aero-Holding Chambers (AEROCHAMBER PLUS) inhaler Use as instructed   [DISCONTINUED] albuterol (PROVENTIL HFA;VENTOLIN HFA) 108 (90 Base) MCG/ACT inhaler Inhale 1-2 puffs into the lungs every 6 (six) hours as needed for wheezing or shortness of breath. (Patient not taking: Reported on 11/08/2016)   [DISCONTINUED] azithromycin (ZITHROMAX) 250 MG tablet Take 1 tablet (250 mg total) by mouth daily. Take first 2 tablets together, then 1 every day until finished.   [DISCONTINUED] benzonatate (TESSALON) 100 MG capsule Take 1 capsule (100 mg total) by mouth every 8 (eight) hours.   [DISCONTINUED] cyclobenzaprine (FLEXERIL) 5 MG tablet Take 1 tablet (5 mg total) by mouth 3 (three) times daily as needed for muscle spasms.   [DISCONTINUED] dexamethasone (DECADRON) 4 MG tablet 4 tabs (16 mg) po at once tomorrow (Patient not taking: Reported on 11/08/2016)   [DISCONTINUED] dimenhyDRINATE (DRAMAMINE) 50 MG tablet Take 50 mg by mouth every 8 (eight) hours as needed.   [DISCONTINUED] ibuprofen (ADVIL,MOTRIN) 600 MG tablet Take 1 tablet (600 mg total) by mouth every 6 (six) hours as needed. (Patient not taking: No sig reported)   [DISCONTINUED] lidocaine (XYLOCAINE) 2 % solution Use as directed 15 mLs in the mouth or throat as needed for mouth pain.   No facility-administered encounter medications on file as of 12/04/2020.  1. Wellness examination Patient given appointment to establish care at patient care on February 04, 2021 - CBC with Differential/Platelet -  Comp. Metabolic Panel (12) - TSH  2. Acute bilateral thoracic back pain UA negative for urinary tract infection, trial Flexeril, trial Voltaren.  Patient education given on supportive care.  Red flags given for prompt reevaluation. - Urine Culture - POCT URINALYSIS DIP (CLINITEK) - cyclobenzaprine (FLEXERIL) 10 MG tablet; Take 1 tablet (10 mg total) by mouth 3 (three) times daily as needed for muscle spasms.  Dispense: 30 tablet; Refill: 0 - diclofenac Sodium (VOLTAREN) 1 % GEL; Apply 4 g topically 4 (four) times daily.  Dispense: 100 g; Refill: 1  3. Rash and nonspecific skin eruption Currently resolved, did encourage patient to do trial of Zyrtec.  Patient requested referral to allergy specialist due to recurrent eruptions. - Ambulatory referral to Allergy - cetirizine (ZYRTEC) 10 MG tablet; Take 1 tablet (10 mg total) by mouth daily.  Dispense: 30 tablet; Refill: 11  4. Psychophysiological insomnia Patient education given on good sleep hygiene, trial melatonin  5. PTSD (post-traumatic stress disorder) Patient agreeable to CBT - Ambulatory referral to Social Work  6. Hypokalemia History of hypokalemia, recheck  7. Sickle cell trait (HCC)    8. Encounter for HCV screening test for low risk patient  - HCV Ab w Reflex to Quant PCR  9. Screening for HIV without presence of risk factors  - HIV antibody (with reflex)    I have reviewed the patient's medical history (PMH, PSH, Social History, Family History, Medications, and allergies) , and have been updated if relevant. I spent 32 minutes reviewing chart and  face to face time with patient.    Follow-up: Return in about 2 months (around 02/04/2021) for [facility].   Kasandra Knudsen Mayers, PA-C

## 2020-12-04 NOTE — Patient Instructions (Signed)
For your chest tightness and rashes, I encourage you to take Zyrtec on a daily basis.  I have started a referral for you to be seen by an allergy specialist for further evaluation  For your back pain, I encourage you to increase your hydration, do gentle stretching prior to and after work, I sent a muscle relaxer and a pain cream to your pharmacy.  I started a referral for you to be seen for counseling services for your PTSD.  For your difficulty sleeping, I encourage you to try melatonin over-the-counter.  Sleep is very important to help maintaining good mental health as well as allowing your body to repair itself.  We will call you with today's lab results.  Roney Jaffeari S. Mayers, PA-C Physician Assistant [facility] [email or web address]   Acute Back Pain, Adult Acute back pain is sudden and usually short-lived. It is often caused by an injury to the muscles and tissues in the back. The injury may result from: A muscle or ligament getting overstretched or torn (strained). Ligaments are tissues that connect bones to each other. Lifting something improperly can cause a back strain. Wear and tear (degeneration) of the spinal disks. Spinal disks are circular tissue that provide cushioning between the bones of the spine (vertebrae). Twisting motions, such as while playing sports or doing yard work. A hit to the back. Arthritis. You may have a physical exam, lab tests, and imaging tests to find the cause ofyour pain. Acute back pain usually goes away with rest and home care. Follow these instructions at home: Managing pain, stiffness, and swelling Treatment may include medicines for pain and inflammation that are taken by mouth or applied to the skin, prescription pain medicine, or muscle relaxants. Take over-the-counter and prescription medicines only as told by your health care provider. Your health care provider may recommend applying ice  during the first 24-48 hours after your pain starts. To do this: Put ice in a plastic bag. Place a towel between your skin and the bag. Leave the ice on for 20 minutes, 2-3 times a day. If directed, apply heat to the affected area as often as told by your health care provider. Use the heat source that your health care provider recommends, such as a moist heat pack or a heating pad. Place a towel between your skin and the heat source. Leave the heat on for 20-30 minutes. Remove the heat if your skin turns bright red. This is especially important if you are unable to feel pain, heat, or cold. You have a greater risk of getting burned. Activity  Do not stay in bed. Staying in bed for more than 1-2 days can delay your recovery. Sit up and stand up straight. Avoid leaning forward when you sit or hunching over when you stand. If you work at a desk, sit close to it so you do not need to lean over. Keep your chin tucked in. Keep your neck drawn back, and keep your elbows bent at a 90-degree angle (right angle). Sit high and close to the steering wheel when you drive. Add lower back (lumbar) support to your car seat, if needed. Take short walks on even surfaces as soon as you are able. Try to increase the length of time you walk each day. Do not sit, drive, or stand in one place for more than 30 minutes at a time. Sitting or standing for long periods of time can put stress on your back. Do not drive or use heavy  machinery while taking prescription pain medicine. Use proper lifting techniques. When you bend and lift, use positions that put less stress on your back: [place] your knees. Keep the load close to your body. Avoid twisting. Exercise regularly as told by your health care provider. Exercising helps your back heal faster and helps prevent back injuries by keeping muscles strong and flexible. Work with a physical therapist to make a safe exercise program, as recommended by your health care provider.  Do any exercises as told by your physical therapist.  Lifestyle Maintain a healthy weight. Extra weight puts stress on your back and makes it difficult to have good posture. Avoid activities or situations that make you feel anxious or stressed. Stress and anxiety increase muscle tension and can make back pain worse. Learn ways to manage anxiety and stress, such as through exercise. General instructions Sleep on a firm mattress in a comfortable position. Try lying on your side with your knees slightly bent. If you lie on your back, put a pillow under your knees. Follow your treatment plan as told by your health care provider. This may include: Cognitive or behavioral therapy. Acupuncture or massage therapy. Meditation or yoga. Contact a health care provider if: You have pain that is not relieved with rest or medicine. You have increasing pain going down into your legs or buttocks. Your pain does not improve after 2 weeks. You have pain at night. You lose weight without trying. You have a fever or chills. Get help right away if: You develop new bowel or bladder control problems. You have unusual weakness or numbness in your arms or legs. You develop nausea or vomiting. You develop abdominal pain. You feel faint. Summary Acute back pain is sudden and usually short-lived. Use proper lifting techniques. When you bend and lift, use positions that put less stress on your back. Take over-the-counter and prescription medicines and apply heat or ice as directed by your health care provider. This information is not intended to replace advice given to you by your health care provider. Make sure you discuss any questions you have with your healthcare provider. Document Revised: 02/14/2020 Document Reviewed: 02/17/2020 Elsevier Patient Education  2022 [employer]  Insomnia Insomnia is a sleep disorder that makes it difficult to fall asleep or stay asleep. Insomnia can cause fatigue, low energy,  difficulty concentrating, moodswings, and poor performance at work or school. There are three different ways to classify insomnia: Difficulty falling asleep. Difficulty staying asleep. Waking up too early in the morning. Any type of insomnia can be long-term (chronic) or short-term (acute). Both are common. Short-term insomnia usually lasts for three months or less. Chronic insomnia occurs at least three times a week for longer than threemonths. What are the causes? Insomnia may be caused by another condition, situation, or substance, such as: Anxiety. Certain medicines. Gastroesophageal reflux disease (GERD) or other gastrointestinal conditions. Asthma or other breathing conditions. Restless legs syndrome, sleep apnea, or other sleep disorders. Chronic pain. Menopause. Stroke. Abuse of alcohol, tobacco, or illegal drugs. Mental health conditions, such as depression. Caffeine. Neurological disorders, such as Alzheimer's disease. An overactive thyroid (hyperthyroidism). Sometimes, the cause of insomnia may not be known. What increases the risk? Risk factors for insomnia include: Gender. Women are affected more often than men. Age. Insomnia is more common as you get older. Stress. Lack of exercise. Irregular work schedule or working night shifts. Traveling between different time zones. Certain medical and mental health conditions. What are the signs or symptoms? If  you have insomnia, the main symptom is having trouble falling asleep or having trouble staying asleep. This may lead to other symptoms, such as: Feeling fatigued or having low energy. Feeling nervous about going to sleep. Not feeling rested in the morning. Having trouble concentrating. Feeling irritable, anxious, or depressed. How is this diagnosed? This condition may be diagnosed based on: Your symptoms and medical history. Your health care provider may ask about: Your sleep habits. Any medical conditions you  have. Your mental health. A physical exam. How is this treated? Treatment for insomnia depends on the cause. Treatment may focus on treating an underlying condition that is causing insomnia. Treatment may also include: Medicines to help you sleep. Counseling or therapy. Lifestyle adjustments to help you sleep better. Follow these instructions at home: Eating and drinking  Limit or avoid alcohol, caffeinated beverages, and cigarettes, especially close to bedtime. These can disrupt your sleep. Do not eat a large meal or eat spicy foods right before bedtime. This can lead to digestive discomfort that can make it hard for you to sleep.  Sleep habits  Keep a sleep diary to help you and your health care provider figure out what could be causing your insomnia. Write down: When you sleep. When you wake up during the night. How well you sleep. How rested you feel the next day. Any side effects of medicines you are taking. What you eat and drink. Make your bedroom a dark, comfortable place where it is easy to fall asleep. Put up shades or blackout curtains to block light from outside. Use a white noise machine to block noise. Keep the temperature cool. Limit screen use before bedtime. This includes: Watching TV. Using your smartphone, tablet, or computer. Stick to a routine that includes going to bed and waking up at the same times every day and night. This can help you fall asleep faster. Consider making a quiet activity, such as reading, part of your nighttime routine. Try to avoid taking naps during the day so that you sleep better at night. Get out of bed if you are still awake after 15 minutes of trying to sleep. Keep the lights down, but try reading or doing a quiet activity. When you feel sleepy, go back to bed.  General instructions Take over-the-counter and prescription medicines only as told by your health care provider. Exercise regularly, as told by your health care provider.  Avoid exercise starting several hours before bedtime. Use relaxation techniques to manage stress. Ask your health care provider to suggest some techniques that may work well for you. These may include: Breathing exercises. Routines to release muscle tension. Visualizing peaceful scenes. Make sure that you drive carefully. Avoid driving if you feel very sleepy. Keep all follow-up visits as told by your health care provider. This is important. Contact a health care provider if: You are tired throughout the day. You have trouble in your daily routine due to sleepiness. You continue to have sleep problems, or your sleep problems get worse. Get help right away if: You have serious thoughts about hurting yourself or someone else. If you ever feel like you may hurt yourself or others, or have thoughts about taking your own life, get help right away. You can go to your nearest emergency department or call: Your local emergency services (911 in the [place] A suicide crisis helpline, such as the National Suicide Prevention Lifeline at 7258132324. This is open 24 hours a day. Summary Insomnia is a sleep disorder that makes it  difficult to fall asleep or stay asleep. Insomnia can be long-term (chronic) or short-term (acute). Treatment for insomnia depends on the cause. Treatment may focus on treating an underlying condition that is causing insomnia. Keep a sleep diary to help you and your health care provider figure out what could be causing your insomnia. This information is not intended to replace advice given to you by your health care provider. Make sure you discuss any questions you have with your healthcare provider. Document Revised: 04/05/2020 Document Reviewed: 04/05/2020 Elsevier Patient Education  2022 [employer].

## 2020-12-04 NOTE — Progress Notes (Signed)
Patient verified DOB Patient has eaten today. Patient has not taken medication today. 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 Patient reports same rash appeared on the left arm.

## 2020-12-05 DIAGNOSIS — F5104 Psychophysiologic insomnia: Secondary | ICD-10-CM | POA: Insufficient documentation

## 2020-12-05 DIAGNOSIS — R7989 Other specified abnormal findings of blood chemistry: Secondary | ICD-10-CM | POA: Insufficient documentation

## 2020-12-05 DIAGNOSIS — M546 Pain in thoracic spine: Secondary | ICD-10-CM | POA: Insufficient documentation

## 2020-12-05 DIAGNOSIS — D573 Sickle-cell trait: Secondary | ICD-10-CM | POA: Insufficient documentation

## 2020-12-05 DIAGNOSIS — E876 Hypokalemia: Secondary | ICD-10-CM | POA: Insufficient documentation

## 2020-12-05 DIAGNOSIS — F4310 Post-traumatic stress disorder, unspecified: Secondary | ICD-10-CM | POA: Insufficient documentation

## 2020-12-05 LAB — HIV ANTIBODY (ROUTINE TESTING W REFLEX): HIV Screen 4th Generation wRfx: NONREACTIVE

## 2020-12-05 LAB — COMP. METABOLIC PANEL (12)
AST: 18 IU/L (ref 0–40)
Albumin/Globulin Ratio: 2.4 — ABNORMAL HIGH (ref 1.2–2.2)
Albumin: 4.6 g/dL (ref 3.9–5.0)
Alkaline Phosphatase: 63 IU/L (ref 44–121)
BUN/Creatinine Ratio: 21 (ref 9–23)
BUN: 13 mg/dL (ref 6–20)
Bilirubin Total: 0.4 mg/dL (ref 0.0–1.2)
Calcium: 9.6 mg/dL (ref 8.7–10.2)
Chloride: 104 mmol/L (ref 96–106)
Creatinine, Ser: 0.63 mg/dL (ref 0.57–1.00)
Globulin, Total: 1.9 g/dL (ref 1.5–4.5)
Glucose: 83 mg/dL (ref 65–99)
Potassium: 3.5 mmol/L (ref 3.5–5.2)
Sodium: 143 mmol/L (ref 134–144)
Total Protein: 6.5 g/dL (ref 6.0–8.5)
eGFR: 122 mL/min/{1.73_m2} (ref >59)

## 2020-12-05 LAB — CBC WITH DIFFERENTIAL/PLATELET
Basophils Absolute: 0 10*3/uL (ref 0.0–0.2)
Basos: 1 %
EOS (ABSOLUTE): 0.1 10*3/uL (ref 0.0–0.4)
Eos: 3 %
Hematocrit: 37.1 % (ref 34.0–46.6)
Hemoglobin: 11.2 g/dL (ref 11.1–15.9)
Immature Grans (Abs): 0 10*3/uL (ref 0.0–0.1)
Immature Granulocytes: 1 %
Lymphocytes Absolute: 1.1 10*3/uL (ref 0.7–3.1)
Lymphs: 31 %
MCH: 23.3 pg — ABNORMAL LOW (ref 26.6–33.0)
MCHC: 30.2 g/dL — ABNORMAL LOW (ref 31.5–35.7)
MCV: 77 fL — ABNORMAL LOW (ref 79–97)
Monocytes Absolute: 0.3 10*3/uL (ref 0.1–0.9)
Monocytes: 8 %
Neutrophils Absolute: 2 10*3/uL (ref 1.4–7.0)
Neutrophils: 56 %
RBC: 4.81 x10E6/uL (ref 3.77–5.28)
RDW: 13 % (ref 11.7–15.4)
WBC: 3.6 10*3/uL (ref 3.4–10.8)

## 2020-12-05 LAB — HCV INTERPRETATION

## 2020-12-05 LAB — TSH: TSH: 0.356 u[IU]/mL — ABNORMAL LOW (ref 0.450–4.500)

## 2020-12-05 LAB — HCV AB W REFLEX TO QUANT PCR: HCV Ab: <0.1 s/co ratio (ref 0.0–0.9)

## 2020-12-05 NOTE — Addendum Note (Signed)
Addended by: Roney Jaffe on: 12/05/2020 12:22 PM   Modules accepted: Orders

## 2020-12-06 ENCOUNTER — Telehealth: Payer: Self-pay | Admitting: *Deleted

## 2020-12-06 NOTE — Telephone Encounter (Signed)
MA UTR patient on any contacts listed with no VM option.

## 2020-12-06 NOTE — Telephone Encounter (Signed)
-----   Message from Roney Jaffe, [place] sent at 12/05/2020 12:21 PM EDT ----- Please call patient and let her know that her kidney function and liver function are within normal limits.  She does not show signs of anemia.  Her screening for hepatitis C and HIV were negative.  Her thyroid lab was abnormal, she needs to return to the mobile unit for repeat testing prior to initiating any type of treatment.

## 2020-12-07 LAB — URINE CULTURE

## 2020-12-14 ENCOUNTER — Telehealth: Payer: Self-pay | Admitting: Clinical

## 2020-12-17 NOTE — Telephone Encounter (Signed)
Attempted to call pt no answer, left voicemail. Will attempt once more.

## 2020-12-18 NOTE — Telephone Encounter (Signed)
Patient called in for Angela Cobb say that she was in the process of moving and missed previous calls can be reached at Ph# [phone]

## 2020-12-20 NOTE — Telephone Encounter (Signed)
Attempted to call pt again, no answer, left vm. Will try once more.

## 2020-12-26 NOTE — Telephone Encounter (Signed)
Spoke with pt and virtual visit scheduled for 01/01/21 at 3:30

## 2021-01-01 ENCOUNTER — Emergency Department (HOSPITAL_COMMUNITY): Payer: Medicaid Other

## 2021-01-01 ENCOUNTER — Encounter: Payer: Medicaid Other | Admitting: Clinical

## 2021-01-01 ENCOUNTER — Other Ambulatory Visit: Payer: Self-pay

## 2021-01-01 ENCOUNTER — Emergency Department (HOSPITAL_COMMUNITY)
Admission: EM | Admit: 2021-01-01 | Discharge: 2021-01-02 | Disposition: A | Discharge status: Other Acute Inpatient Hospital | Payer: Medicaid Other | Attending: Emergency Medicine | Admitting: Emergency Medicine

## 2021-01-01 DIAGNOSIS — R519 Headache, unspecified: Secondary | ICD-10-CM | POA: Diagnosis not present

## 2021-01-01 DIAGNOSIS — F17200 Nicotine dependence, unspecified, uncomplicated: Secondary | ICD-10-CM | POA: Insufficient documentation

## 2021-01-01 DIAGNOSIS — Z8659 Personal history of other mental and behavioral disorders: Secondary | ICD-10-CM | POA: Diagnosis not present

## 2021-01-01 DIAGNOSIS — F4310 Post-traumatic stress disorder, unspecified: Secondary | ICD-10-CM | POA: Diagnosis not present

## 2021-01-01 DIAGNOSIS — J45909 Unspecified asthma, uncomplicated: Secondary | ICD-10-CM | POA: Diagnosis not present

## 2021-01-01 DIAGNOSIS — R0789 Other chest pain: Secondary | ICD-10-CM | POA: Insufficient documentation

## 2021-01-01 DIAGNOSIS — Y901 Blood alcohol level of 20-39 mg/100 ml: Secondary | ICD-10-CM | POA: Diagnosis not present

## 2021-01-01 DIAGNOSIS — F4325 Adjustment disorder with mixed disturbance of emotions and conduct: Secondary | ICD-10-CM | POA: Diagnosis not present

## 2021-01-01 DIAGNOSIS — Z638 Other specified problems related to primary support group: Secondary | ICD-10-CM

## 2021-01-01 DIAGNOSIS — S50312A Abrasion of left elbow, initial encounter: Secondary | ICD-10-CM | POA: Insufficient documentation

## 2021-01-01 DIAGNOSIS — Z880 Allergy status to penicillin: Secondary | ICD-10-CM | POA: Insufficient documentation

## 2021-01-01 DIAGNOSIS — Z79899 Other long term (current) drug therapy: Secondary | ICD-10-CM | POA: Diagnosis not present

## 2021-01-01 DIAGNOSIS — Z23 Encounter for immunization: Secondary | ICD-10-CM | POA: Diagnosis not present

## 2021-01-01 DIAGNOSIS — M542 Cervicalgia: Secondary | ICD-10-CM | POA: Insufficient documentation

## 2021-01-01 DIAGNOSIS — M25571 Pain in right ankle and joints of right foot: Secondary | ICD-10-CM | POA: Insufficient documentation

## 2021-01-01 DIAGNOSIS — S59902A Unspecified injury of left elbow, initial encounter: Secondary | ICD-10-CM | POA: Diagnosis present

## 2021-01-01 DIAGNOSIS — R45851 Suicidal ideations: Secondary | ICD-10-CM

## 2021-01-01 DIAGNOSIS — Z20822 Contact with and (suspected) exposure to covid-19: Secondary | ICD-10-CM | POA: Insufficient documentation

## 2021-01-01 MED ORDER — TETANUS-DIPHTH-ACELL PERTUSSIS 5-2.5-18.5 LF-MCG/0.5 IM SUSY
0.5000 mL | PREFILLED_SYRINGE | Freq: Once | INTRAMUSCULAR | Status: AC
  Administered 2021-01-02: 0.5 mL via INTRAMUSCULAR
  Filled 2021-01-01: qty 0.5

## 2021-01-01 MED ORDER — IBUPROFEN 400 MG PO TABS
600.0000 mg | ORAL_TABLET | Freq: Once | ORAL | Status: AC
  Administered 2021-01-01: 600 mg via ORAL
  Filled 2021-01-01: qty 1
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
## 2021-01-02 ENCOUNTER — Encounter (HOSPITAL_COMMUNITY): Payer: Self-pay | Admitting: Registered Nurse

## 2021-01-02 ENCOUNTER — Other Ambulatory Visit: Payer: Self-pay

## 2021-01-02 ENCOUNTER — Ambulatory Visit (HOSPITAL_COMMUNITY)
Admission: EM | Admit: 2021-01-02 | Discharge: 2021-01-03 | Disposition: A | Discharge status: Home / Self Care | Payer: Medicaid Other | Source: Home / Self Care

## 2021-01-02 DIAGNOSIS — Z8659 Personal history of other mental and behavioral disorders: Secondary | ICD-10-CM | POA: Insufficient documentation

## 2021-01-02 DIAGNOSIS — F4310 Post-traumatic stress disorder, unspecified: Secondary | ICD-10-CM | POA: Insufficient documentation

## 2021-01-02 DIAGNOSIS — Z880 Allergy status to penicillin: Secondary | ICD-10-CM | POA: Insufficient documentation

## 2021-01-02 DIAGNOSIS — F32A Depression, unspecified: Secondary | ICD-10-CM | POA: Insufficient documentation

## 2021-01-02 DIAGNOSIS — J45909 Unspecified asthma, uncomplicated: Secondary | ICD-10-CM | POA: Diagnosis not present

## 2021-01-02 DIAGNOSIS — Z20822 Contact with and (suspected) exposure to covid-19: Secondary | ICD-10-CM | POA: Diagnosis not present

## 2021-01-02 DIAGNOSIS — F17200 Nicotine dependence, unspecified, uncomplicated: Secondary | ICD-10-CM | POA: Insufficient documentation

## 2021-01-02 DIAGNOSIS — F4325 Adjustment disorder with mixed disturbance of emotions and conduct: Secondary | ICD-10-CM

## 2021-01-02 DIAGNOSIS — S50312A Abrasion of left elbow, initial encounter: Secondary | ICD-10-CM | POA: Diagnosis not present

## 2021-01-02 DIAGNOSIS — Z638 Other specified problems related to primary support group: Secondary | ICD-10-CM

## 2021-01-02 LAB — RAPID URINE DRUG SCREEN, HOSP PERFORMED
Amphetamines: NOT DETECTED
Barbiturates: NOT DETECTED
Benzodiazepines: NOT DETECTED
Cocaine: POSITIVE — AB
Opiates: NOT DETECTED
Tetrahydrocannabinol: POSITIVE — AB

## 2021-01-02 LAB — COMPREHENSIVE METABOLIC PANEL
ALT: 15 U/L (ref 0–44)
AST: 29 U/L (ref 15–41)
Albumin: 4.3 g/dL (ref 3.5–5.0)
Alkaline Phosphatase: 62 U/L (ref 38–126)
Anion gap: 8 (ref 5–15)
BUN: 15 mg/dL (ref 6–20)
CO2: 25 mmol/L (ref 22–32)
Calcium: 9.3 mg/dL (ref 8.9–10.3)
Chloride: 104 mmol/L (ref 98–111)
Creatinine, Ser: 0.94 mg/dL (ref 0.44–1.00)
GFR, Estimated: >60 mL/min (ref >60)
Glucose, Bld: 91 mg/dL (ref 70–99)
Potassium: 3.3 mmol/L — ABNORMAL LOW (ref 3.5–5.1)
Sodium: 137 mmol/L (ref 135–145)
Total Bilirubin: 0.7 mg/dL (ref 0.3–1.2)
Total Protein: 7.2 g/dL (ref 6.5–8.1)

## 2021-01-02 LAB — CBC WITH DIFFERENTIAL/PLATELET
Abs Immature Granulocytes: 0.03 10*3/uL (ref 0.00–0.07)
Basophils Absolute: 0 10*3/uL (ref 0.0–0.1)
Basophils Relative: 0 %
Eosinophils Absolute: 0.1 10*3/uL (ref 0.0–0.5)
Eosinophils Relative: 1 %
HCT: 37.8 % (ref 36.0–46.0)
Hemoglobin: 11.7 g/dL — ABNORMAL LOW (ref 12.0–15.0)
Immature Granulocytes: 0 %
Lymphocytes Relative: 14 %
Lymphs Abs: 1.2 10*3/uL (ref 0.7–4.0)
MCH: 22.9 pg — ABNORMAL LOW (ref 26.0–34.0)
MCHC: 31 g/dL (ref 30.0–36.0)
MCV: 74.1 fL — ABNORMAL LOW (ref 80.0–100.0)
Monocytes Absolute: 0.6 10*3/uL (ref 0.1–1.0)
Monocytes Relative: 7 %
Neutro Abs: 6.5 10*3/uL (ref 1.7–7.7)
Neutrophils Relative %: 78 %
Platelets: 338 10*3/uL (ref 150–400)
RBC: 5.1 MIL/uL (ref 3.87–5.11)
RDW: 13.6 % (ref 11.5–15.5)
WBC: 8.4 10*3/uL (ref 4.0–10.5)
nRBC: 0 % (ref 0.0–0.2)

## 2021-01-02 LAB — ACETAMINOPHEN LEVEL: Acetaminophen (Tylenol), Serum: <10 ug/mL — ABNORMAL LOW (ref 10–30)

## 2021-01-02 LAB — SALICYLATE LEVEL: Salicylate Lvl: <7 mg/dL — ABNORMAL LOW (ref 7.0–30.0)

## 2021-01-02 LAB — RESP PANEL BY RT-PCR (FLU A&B, COVID) ARPGX2
Influenza A by PCR: NEGATIVE
Influenza B by PCR: NEGATIVE
SARS Coronavirus 2 by RT PCR: NEGATIVE

## 2021-01-02 LAB — ETHANOL: Alcohol, Ethyl (B): 22 mg/dL — ABNORMAL HIGH (ref <10)

## 2021-01-02 MED ORDER — ACETAMINOPHEN 325 MG PO TABS
650.0000 mg | ORAL_TABLET | Freq: Once | ORAL | Status: AC
  Administered 2021-01-02: 650 mg via ORAL
  Filled 2021-01-02: qty 2

## 2021-01-02 MED ORDER — ALUM & MAG HYDROXIDE-SIMETH 200-200-20 MG/5ML PO SUSP: 30.0000 mL | ORAL | Status: DC | PRN

## 2021-01-02 MED ORDER — OLANZAPINE 5 MG PO TABS
5.0000 mg | ORAL_TABLET | Freq: Every day | ORAL | Status: DC
  Administered 2021-01-03: 5 mg via ORAL
  Filled 2021-01-02: qty 14
  Filled 2021-01-02: qty 1

## 2021-01-02 MED ORDER — ALUM & MAG HYDROXIDE-SIMETH 200-200-20 MG/5ML PO SUSP: 30.0000 mL | Freq: Four times a day (QID) | ORAL | Status: DC | PRN

## 2021-01-02 MED ORDER — IBUPROFEN 400 MG PO TABS
600.0000 mg | ORAL_TABLET | Freq: Three times a day (TID) | ORAL | Status: DC | PRN
  Administered 2021-01-02: 600 mg via ORAL
  Filled 2021-01-02: qty 1

## 2021-01-02 MED ORDER — QUETIAPINE FUMARATE 50 MG PO TABS
50.0000 mg | ORAL_TABLET | Freq: Every day | ORAL | Status: DC
  Filled 2021-01-02: qty 1

## 2021-01-02 MED ORDER — ONDANSETRON HCL 4 MG PO TABS: 4.0000 mg | ORAL_TABLET | Freq: Three times a day (TID) | ORAL | Status: DC | PRN

## 2021-01-02 MED ORDER — HYDROXYZINE HCL 25 MG PO TABS
25.0000 mg | ORAL_TABLET | Freq: Three times a day (TID) | ORAL | Status: DC | PRN
  Administered 2021-01-03: 25 mg via ORAL
  Filled 2021-01-02: qty 1
  Filled 2021-01-02: qty 20

## 2021-01-02 MED ORDER — ACETAMINOPHEN 325 MG PO TABS
650.0000 mg | ORAL_TABLET | Freq: Four times a day (QID) | ORAL | Status: DC | PRN
  Administered 2021-01-03: 650 mg via ORAL
  Filled 2021-01-02: qty 2

## 2021-01-02 MED ORDER — NICOTINE 21 MG/24HR TD PT24
21.0000 mg | MEDICATED_PATCH | Freq: Once | TRANSDERMAL | Status: DC
  Administered 2021-01-02: 21 mg via TRANSDERMAL
  Filled 2021-01-02: qty 1

## 2021-01-02 MED ORDER — BACITRACIN-NEOMYCIN-POLYMYXIN 400-5-5000 EX OINT
TOPICAL_OINTMENT | CUTANEOUS | Status: AC
  Filled 2021-01-02: qty 1

## 2021-01-02 MED ORDER — MAGNESIUM HYDROXIDE 400 MG/5ML PO SUSP: 30.0000 mL | Freq: Every day | ORAL | Status: DC | PRN

## 2021-01-02 NOTE — ED Notes (Signed)
Pt belongings inventoried and placed in locker 11 in [facility].
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
## 2021-01-02 NOTE — ED Notes (Signed)
Pt having TTS done.
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
## 2021-01-02 NOTE — ED Notes (Signed)
The patient's sitter left the patient to find shower supplies for the patient. This EMT was keeping an eye the patient from a distance and when the patient was no longer visible, this EMT walked to the line of sight of the patient and found the patient standing on her bed with a hand on a ceiling tile, trying to push up the corner. This EMT asked the patient what she was doing. She stated she thought something was in the ceiling. She then sat down on the bed and remained seated until her sitter returned with shower supplies. Monique-RN was informed of this incident.

## 2021-01-02 NOTE — ED Notes (Signed)
Pt requesting tylenol for HA

## 2021-01-02 NOTE — Progress Notes (Signed)
Orthopedic Tech Progress Note Patient Details:  Angela Cobb 12/13/1990 [id number]  Ortho Devices Type of Ortho Device: ASO Ortho Device/Splint Location: RLE Ortho Device/Splint Interventions: Ordered, Application, Adjustment   Post Interventions Patient Tolerated: Well Instructions Provided: Adjustment of device, Care of device, Poper ambulation with device  Angela Cobb 01/02/2021, 6:10 AM

## 2021-01-02 NOTE — Social Work (Signed)
Social work consulted for homelessness and outpatient mental health resources.   Per chart Pt will be transferred to another facility  Housing and outpatient health resources added to AVS

## 2021-01-02 NOTE — Discharge Instructions (Signed)
[facility] - No Insurance Required/Free or Reduced Cost/Accepts Medicaid [address] [place], [place] [address] [phone]  [facility] - No Insurance Required/Free or Reduced Cost [address] [place], [place] [address] [phone]  [facility] - Free Services [address], [place] [address] [phone]  [facility] - Medicaid/Other Insurances [address] [place]  [address] [place], [place] [address] [phone] ext [address] - Medicaid/Self Pay [address] [place], [place] [address] [phone]  [facility] - Medicaid [address] [place], [place] [address] [phone]  [facility] - No Insurance Required/Medicaid/Other Insurances [address] [place], [place] [address] [phone]  [facility] - No Insurance Required/Medicaid/Some Services Free/Other Insurances [address], [place] [address] [phone]  [facility] [address], [place] [address] [phone]
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
## 2021-01-02 NOTE — ED Notes (Signed)
Ortho tech at bedside 

## 2021-01-02 NOTE — ED Notes (Signed)
Ortho tech aware of need for ASO brace.

## 2021-01-02 NOTE — ED Notes (Addendum)
The patient attempted to make a phone call. She became agitated after she asked if it was almost seven o'clock. The phone continued to ring and she forcefully placed it back on the receiver. The patient walked back towards her room, but stopped outside the door. The sitter for the patient convinced her to go back inside. The sitter called this EMT over, out of site of the patient, and relayed the message that she felt that the patient was ready to elope, expressing that the patient would not lay down on the bed. John-RN was made aware of the situation. Security came to bedside but was not needed.

## 2021-01-02 NOTE — BH Assessment (Signed)
Clinician attempted to make contact with the providers assigned to pt in an effort to complete pt's MH Assessment, but pt is still in the lobby and has no providers for clinician to make contact with. TTS will attempt at a later time.

## 2021-01-02 NOTE — ED Notes (Signed)
ED Provider at bedside. 
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
## 2021-01-03 LAB — LIPID PANEL
Cholesterol: 161 mg/dL (ref 0–200)
HDL: 68 mg/dL (ref >40)
LDL Cholesterol: 77 mg/dL (ref 0–99)
Total CHOL/HDL Ratio: 2.4 RATIO
Triglycerides: 80 mg/dL (ref <150)
VLDL: 16 mg/dL (ref 0–40)

## 2021-01-03 LAB — TSH: TSH: 1.017 u[IU]/mL (ref 0.350–4.500)

## 2021-01-03 MED ORDER — LORAZEPAM 1 MG PO TABS: 1.0000 mg | ORAL_TABLET | Freq: Four times a day (QID) | ORAL | Status: DC | PRN

## 2021-01-03 MED ORDER — LOPERAMIDE HCL 2 MG PO CAPS: 2.0000 mg | ORAL_CAPSULE | ORAL | Status: DC | PRN

## 2021-01-03 MED ORDER — ONDANSETRON 4 MG PO TBDP: 4.0000 mg | ORAL_TABLET | Freq: Four times a day (QID) | ORAL | Status: DC | PRN

## 2021-01-03 MED ORDER — LORAZEPAM 1 MG PO TABS
1.0000 mg | ORAL_TABLET | Freq: Once | ORAL | Status: AC
  Administered 2021-01-03: 1 mg via ORAL
  Filled 2021-01-03: qty 1

## 2021-01-03 MED ORDER — HYDROXYZINE HCL 25 MG PO TABS: 25.0000 mg | ORAL_TABLET | Freq: Three times a day (TID) | ORAL | 0 refills | Status: AC | PRN

## 2021-01-03 MED ORDER — OLANZAPINE 5 MG PO TABS: 5.0000 mg | ORAL_TABLET | Freq: Every day | ORAL | 0 refills | Status: AC

## 2021-01-03 NOTE — ED Triage Notes (Signed)
Patient c/o rib pain, Tylenol given and med ordered for bedtime given - patient was resting and has just now awakened. Patient tearful and missing her kids. States that is all she has in her life. Encouragement provided. Will continue to monitor for patient safety

## 2021-01-03 NOTE — ED Notes (Signed)
Patient sleeping - respirations even and unlabored - no distress at this time. Will continue to monitor for safety

## 2021-01-03 NOTE — Progress Notes (Signed)
Patient resting with unlabored and even respirations at this time. No objective signs of discomfort at this time.

## 2021-01-03 NOTE — ED Notes (Signed)
Order received and implemented

## 2021-01-03 NOTE — Progress Notes (Signed)
Patient alert and oriented denies SI/HI and AVH.

## 2021-01-03 NOTE — Progress Notes (Signed)
CSW contacted [facility] and confirmed child's weight. French Ana stated that the car seat is there if it is needed but that CSW would have to come pick it up. CSW informed French Ana that we would just put that on hold as contact had not been able to be made with pt's family regarding the child. She agreed and stated that CSW could just call her to let her know if he did end up needing to come get it. No other concerns expressed. Contact ended without incident.  Vilma Meckel. Algis Greenhouse, MSW, LCSW, LCAS 01/03/2021 10:47 AM

## 2021-01-03 NOTE — Progress Notes (Signed)
Patient's mother arrived with patient's son. Patient is to be transported to the [place] [facility] via taxi with use of taxi voucher. Patient is discharged waiting in lobby with baby to go to [facility].

## 2021-01-03 NOTE — Progress Notes (Signed)
Patient received vistaril 25 mg for anxiety.  

## 2021-01-03 NOTE — Progress Notes (Signed)
Patient has made multiple calls to get into contact with family about son. Left voice message. Patient called [place] that has a bed but would like to do a face to face interview.

## 2021-01-03 NOTE — Discharge Instructions (Addendum)

## 2021-01-03 NOTE — ED Provider Notes (Signed)
FBC/OBS ASAP Discharge Summary  Date and Time: 01/03/2021 10:43 AM  Name: Angela Cobb  MRN:  [id number]   Discharge Diagnoses:  Final diagnoses:  Adjustment disorder with mixed disturbance of emotions and conduct  PTSD (post-traumatic stress disorder)    Subjective: Patient seen and chart reviewed. She has been medication compliant and has not been a management problem on the unit. Patient slept well last night and was cooperative on assessment. She denies SI/HI/AVH and other complaints. Patient called [facility] and confirmed that she would have a bed available for her and her 30-monthold son; she would do a face-to-face interview upon arrival. Patient was provided with 14 day samples for her Zyprexa 5 mg and Hydroxyzine 25 mg TID PRN.  Stay Summary: Angela Cobb a 30year old female with a history of PTSD and ADHD [facility] suicidal statements made about killing herself if her son was taken from her. She was evaluated and started on Zyprexa 5 mg QHS for mood stabilization. Upon reassessment, her first exam was completed, and she no longer met criteria for IVC. She was discharged to [facility].  Total Time spent with patient: 20 minutes  Past Psychiatric History: See H&P Past Medical History:  Past Medical History:  Diagnosis Date   Anemia    Asthma    Kidney stone    Polycystic ovarian syndrome    Renal disorder    No past surgical history on file. Family History: No family history on file. Family Psychiatric History: See H&P Social History:  Social History   Substance and Sexual Activity  Alcohol Use No     Social History   Substance and Sexual Activity  Drug Use No    Social History   Socioeconomic History   Marital status: Single    Spouse name: Not on file   Number of children: Not on file   Years of education: Not on file   Highest education level: Not on file  Occupational History   Not on file  Tobacco Use   Smoking status: Every Day    Smokeless tobacco: Never  Substance and Sexual Activity   Alcohol use: No   Drug use: No   Sexual activity: Yes    Birth control/protection: Condom, None  Other Topics Concern   Not on file  Social History Narrative   Not on file   Social Determinants of Health   Financial Resource Strain: Not on file  Food Insecurity: Not on file  Transportation Needs: Not on file  Physical Activity: Not on file  Stress: Not on file  Social Connections: Not on file   SDOH:  SDOH Screenings   Alcohol Screen: Not on file  Depression (PHQ2-9): Medium Risk   PHQ-2 Score: 8  Financial Resource Strain: Not on file  Food Insecurity: Not on file  Housing: Not on file  Physical Activity: Not on file  Social Connections: Not on file  Stress: Not on file  Tobacco Use: High Risk   Smoking Tobacco Use: Every Day   Smokeless Tobacco Use: Never  Transportation Needs: Not on file    Current Medications:  Current Facility-Administered Medications  Medication Dose Route Frequency Provider Last Rate Last Admin   acetaminophen (TYLENOL) tablet 650 mg  650 mg Oral Q6H PRN BLindon RompA, NP   650 mg at 01/03/21 0315   alum & mag hydroxide-simeth (MAALOX/MYLANTA) 200-200-20 MG/5ML suspension 30 mL  30 mL Oral Q4H PRN BRozetta Nunnery NP  hydrOXYzine (ATARAX/VISTARIL) tablet 25 mg  25 mg Oral TID PRN Rozetta Nunnery, NP   25 mg at 01/03/21 0913   loperamide (IMODIUM) capsule 2-4 mg  2-4 mg Oral PRN Rozetta Nunnery, NP       LORazepam (ATIVAN) tablet 1 mg  1 mg Oral Q6H PRN Rozetta Nunnery, NP       magnesium hydroxide (MILK OF MAGNESIA) suspension 30 mL  30 mL Oral Daily PRN Rozetta Nunnery, NP       OLANZapine (ZYPREXA) tablet 5 mg  5 mg Oral QHS Lindon Romp A, NP   5 mg at 01/03/21 0315   ondansetron (ZOFRAN-ODT) disintegrating tablet 4 mg  4 mg Oral Q6H PRN Rozetta Nunnery, NP       Current Outpatient Medications  Medication Sig Dispense Refill   cetirizine (ZYRTEC) 10 MG tablet Take 1 tablet (10 mg  total) by mouth daily. (Patient not taking: Reported on 01/02/2021) 30 tablet 11   cyclobenzaprine (FLEXERIL) 10 MG tablet Take 1 tablet (10 mg total) by mouth 3 (three) times daily as needed for muscle spasms. 30 tablet 0   diclofenac Sodium (VOLTAREN) 1 % GEL Apply 4 g topically 4 (four) times daily. (Patient not taking: Reported on 01/02/2021) 100 g 1   Spacer/Aero-Holding Chambers (AEROCHAMBER PLUS) inhaler Use as instructed (Patient not taking: Reported on 01/02/2021) 1 each 2    PTA Medications: (Not in a hospital admission)   Musculoskeletal  Strength & Muscle Tone: within normal limits Gait & Station: normal Patient leans: N/A  Psychiatric Specialty Exam  Presentation  General Appearance: Appropriate for Environment  Eye Contact:Good  Speech:Clear and Coherent; Normal Rate  Speech Volume:Normal  Handedness:Right   Mood and Affect  Mood:Euthymic  Affect:Congruent; Appropriate   Thought Process  Thought Processes:Coherent; Linear  Descriptions of Associations:Intact  Orientation:Full (Time, Place and Person)  Thought Content:Logical; WDL  Diagnosis of Schizophrenia or Schizoaffective disorder in past: No    Hallucinations:Hallucinations: None Description of Visual Hallucinations: Patient states that she is a medium sees spirits of family members. She does not feel this is abnormal.  Ideas of Reference:None  Suicidal Thoughts:Suicidal Thoughts: No  Homicidal Thoughts:Homicidal Thoughts: No   Sensorium  Memory:Immediate Good; Recent Good; Remote Good  Judgment:Intact  Insight:Good   Executive Functions  Concentration:Good  Attention Span:Good  [place] of Knowledge:Good  Language:Good   Psychomotor Activity  Psychomotor Activity:Psychomotor Activity: Normal   Assets  Assets:Communication Skills; Desire for Improvement; Resilience   Sleep  Sleep:Sleep: Fair   Nutritional Assessment (For OBS and FBC admissions only) Has the  patient had a weight loss or gain of 10 pounds or more in the last 3 months?: No Has the patient had a decrease in food intake/or appetite?: No Does the patient have dental problems?: No Does the patient have eating habits or behaviors that may be indicators of an eating disorder including binging or inducing vomiting?: No Has the patient recently lost weight without trying?: No Has the patient been eating poorly because of a decreased appetite?: No Malnutrition Screening Tool Score: 0    Physical Exam  Physical Exam Vitals reviewed.  Constitutional:      Appearance: Normal appearance.  HENT:     Head: Normocephalic and atraumatic.     Mouth/Throat:     Comments: Hirsutism present Eyes:     Extraocular Movements: Extraocular movements intact.  Cardiovascular:     Rate and Rhythm: Bradycardia present.  Pulmonary:     Effort:  Pulmonary effort is normal.  Musculoskeletal:        General: Normal range of motion.     Cervical back: Normal range of motion.  Neurological:     General: No focal deficit present.     Mental Status: She is alert and oriented to person, place, and time.  Psychiatric:        Thought Content: Thought content normal.        Judgment: Judgment normal.   Review of Systems  Psychiatric/Behavioral:  Negative for depression, hallucinations, memory loss, substance abuse and suicidal ideas. The patient is not nervous/anxious and does not have insomnia.   All other systems reviewed and are negative. Blood pressure 132/78, pulse (!) 56, temperature (!) 97.5 F (36.4 C), temperature source Tympanic, resp. rate 16, SpO2 100 %. There is no height or weight on file to calculate BMI.  Demographic Factors:  Access to firearms  Loss Factors: Financial problems/change in socioeconomic status  Historical Factors: NA  Risk Reduction Factors:   Responsible for children under 52 years of age, Religious beliefs about death, and Living with another person, especially a  relative  Continued Clinical Symptoms:  Unstable or Poor Therapeutic Relationship  Cognitive Features That Contribute To Risk:  None    Suicide Risk:  Minimal: No identifiable suicidal ideation.  Patients presenting with no risk factors but with morbid ruminations; may be classified as minimal risk based on the severity of the depressive symptoms  Plan Of Care/Follow-up recommendations:  Activity:  Normal, as tolerated Diet:  Regular  Disposition: Discharge to [facility]; obtained a car seat so she will be able to transport son as well.   *SW unable to provide a taxi pass for transport since her bed is pending face-to-face interview, and she is unable to take safe transport since her son is with her, so patient will have to find other shelters in the area (both mother/baby facilities have a waitlist) or return to her mother's home.  Rosezetta Schlatter, MD 01/03/2021, 10:43 AM

## 2021-01-03 NOTE — Progress Notes (Signed)
CSW met with pt briefly after being notified that she had contacted [facility] ([facility]). She expressed interest in going there and stated that she needed to get her child. CSW explained that transportation service only goes directly to facility. She stated that she would call her family to see about getting the child. CSW stated that he would follow up with transportation services.   CSW contacted [facility] and was notified that it is solely for the pt.   CSW met with pt to see if she had gotten in contact with her family and to update regarding transportation. Pt stated that she has not been able to get in contact with her family, despite efforts on her part and on that of the staff. CSW informed her that the transportation service would only provide service for her and spoke with her briefly about potential for cab service. Pt expressed interest. CSW inquired if pt had a car seat. Pt stated that she does not because the one she had a friend took it to [place] with them. CSW stated that he would look into some resources for car seats but could make no guarantees. Pt agreed and also asked the CSW to contact her mother as well. CSW agreed.   CSW contacted [place] ([phone]) and was able to find a potential car seat, but the weight of the child will need to be confirmed. CSW will follow up with pt and give [facility] a call back.   CSW will continue to follow.   Chalmers Guest. Guerry Bruin, MSW, [place], [place] 01/03/2021 10:02 AM

## 2021-01-03 NOTE — ED Notes (Signed)
Patient from [facility], Patient is pleasant and cooperative. Labs and EKG obtained per order. Patient with lacerations to left arm, rt rib and right foot. Dressing applied. Patient says she has some pain at this time - instructed her to let us know if pain level goes to a 4.  Patient states she is not truly suicidal or homocidal except for when it came to her baby. Patient given snack and bracelet and brought to bed. Will continue to monitor for safety

## 2021-01-03 NOTE — Progress Notes (Signed)
CSW attempted contact with mother. Unable to make contact as phone rang once and went to voicemail. HIPPA compliant voicemail left with contact information for follow up.   Vilma Meckel. Algis Greenhouse, MSW, LCSW, LCAS 01/03/2021 10:04 AM

## 2021-01-03 NOTE — ED Notes (Signed)
Patient with mounting anxiety regarding not being with her child - NP messaged for possible med orders

## 2021-01-08 ENCOUNTER — Other Ambulatory Visit: Payer: Self-pay

## 2021-01-08 ENCOUNTER — Encounter: Payer: Medicaid Other | Admitting: Clinical

## 2021-02-04 ENCOUNTER — Ambulatory Visit: Payer: Medicaid Other | Admitting: Nurse Practitioner

## 2021-12-30 ENCOUNTER — Encounter (HOSPITAL_COMMUNITY): Payer: Self-pay | Admitting: Emergency Medicine

## 2021-12-30 ENCOUNTER — Ambulatory Visit (INDEPENDENT_AMBULATORY_CARE_PROVIDER_SITE_OTHER): Payer: Medicaid Other

## 2021-12-30 ENCOUNTER — Ambulatory Visit (HOSPITAL_COMMUNITY)
Admission: EM | Admit: 2021-12-30 | Discharge: 2021-12-30 | Disposition: A | Discharge status: Home / Self Care | Payer: Medicaid Other | Attending: Family Medicine | Admitting: Family Medicine

## 2021-12-30 DIAGNOSIS — M79644 Pain in right finger(s): Secondary | ICD-10-CM | POA: Diagnosis not present

## 2021-12-30 DIAGNOSIS — S6991XA Unspecified injury of right wrist, hand and finger(s), initial encounter: Secondary | ICD-10-CM

## 2021-12-30 DIAGNOSIS — S63602A Unspecified sprain of left thumb, initial encounter: Secondary | ICD-10-CM

## 2021-12-30 DIAGNOSIS — S63601A Unspecified sprain of right thumb, initial encounter: Secondary | ICD-10-CM

## 2021-12-30 MED ORDER — IBUPROFEN 600 MG PO TABS: 600.0000 mg | ORAL_TABLET | Freq: Three times a day (TID) | ORAL | 0 refills | Status: AC | PRN

## 2021-12-30 NOTE — ED Triage Notes (Signed)
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  "Patient is not filing this with workmans comp."  Patient denies any OTC pain meds.

## 2021-12-30 NOTE — ED Provider Notes (Signed)
[facility]    CSN: [id number] Arrival date & time: 12/30/21  1416      History   Chief Complaint Chief Complaint  Patient presents with  . Finger Injury    HPI Angela Cobb is a 31 y.o. female.   HPI Patient presents today for evaluation of a right thumb injury Past Medical History:  Diagnosis Date  . Anemia   . Asthma   . Kidney stone   . Polycystic ovarian syndrome   . Renal disorder     Patient Active Problem List   Diagnosis Date Noted  . Family discord 01/02/2021  . Adjustment disorder with mixed disturbance of emotions and conduct 01/02/2021  . Acute bilateral thoracic back pain 12/05/2020  . Psychophysiological insomnia 12/05/2020  . PTSD (post-traumatic stress disorder) 12/05/2020  . Hypokalemia 12/05/2020  . Sickle cell trait (HCC) 12/05/2020  . Abnormal thyroid blood test 12/05/2020    History reviewed. No pertinent surgical history.  OB History   No obstetric history on file.      Home Medications    Prior to Admission medications   Medication Sig Start Date End Date Taking? Authorizing Provider  cyclobenzaprine (FLEXERIL) 10 MG tablet Take 1 tablet (10 mg total) by mouth 3 (three) times daily as needed for muscle spasms. 12/04/20   Mayers, Cari S, PA-C  hydrOXYzine (ATARAX/VISTARIL) 25 MG tablet Take 1 tablet (25 mg total) by mouth 3 (three) times daily as needed for anxiety. 01/03/21   Lamar Sprinkles, MD  OLANZapine (ZYPREXA) 5 MG tablet Take 1 tablet (5 mg total) by mouth at bedtime. 01/03/21   Lamar Sprinkles, MD  cetirizine (ZYRTEC) 10 MG tablet Take 1 tablet (10 mg total) by mouth daily. Patient not taking: Reported on 01/02/2021 12/04/20 01/03/21  Mayers, Kasandra Knudsen, PA-C    Family History History reviewed. No pertinent family history.  Social History Social History   Tobacco Use  . Smoking status: Every Day  . Smokeless tobacco: Never  Substance Use Topics  . Alcohol use: No  . Drug use: No     Allergies    Amoxicillin   Review of Systems Review of Systems   Physical Exam Triage Vital Signs ED Triage Vitals  Enc Vitals Group     BP 12/30/21 1454 132/80     Pulse Rate 12/30/21 1454 74     Resp 12/30/21 1454 18     Temp 12/30/21 1454 98.4 F (36.9 C)     Temp Source 12/30/21 1454 Oral     SpO2 12/30/21 1454 98 %     Weight 12/30/21 1456 125 lb (56.7 kg)     Height 12/30/21 1456 5\' 6"  (1.676 m)     Head Circumference --      Peak Flow --      Pain Score 12/30/21 1456 8     Pain Loc --      Pain Edu? --      Excl. in GC? --    No data found.  Updated Vital Signs BP 132/80 (BP Location: Right Arm)   Pulse 74   Temp 98.4 F (36.9 C) (Oral)   Resp 18   Ht 5\' 6"  (1.676 m)   Wt 125 lb (56.7 kg)   LMP 12/02/2021   SpO2 98%   BMI 20.18 kg/m   Visual Acuity Right Eye Distance:   Left Eye Distance:   Bilateral Distance:    Right Eye Near:   Left Eye Near:    Bilateral  Near:     Physical Exam   UC Treatments / Results  Labs (all labs ordered are listed, but only abnormal results are displayed) Labs Reviewed - No data to display  EKG   Radiology No results found.  Procedures Procedures (including critical care time)  Medications Ordered in UC Medications - No data to display  Initial Impression / Assessment and Plan / UC Course  I have reviewed the triage vital signs and the nursing notes.  Pertinent labs & imaging results that were available during my care of the patient were reviewed by me and considered in my medical decision making (see chart for details).     *** Final Clinical Impressions(s) / UC Diagnoses   Final diagnoses:  None   Discharge Instructions   None    ED Prescriptions   None    PDMP not reviewed this encounter.

## 2021-12-30 NOTE — Discharge Instructions (Addendum)
Continue thumb splint for comfort until pain resolves and you are able to fully perform movement without any pain.  Start ibuprofen and take twice daily over the next 5 to 7 days or until pain resolves. Apply ice applications directly to thumb joints to help reduce inflammation.  You can continue ice compresses for approximately 3 days this normally will help resolve swelling and tenderness. If pain persist beyond 5 days recommend follow-up at [facility]
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
## 2023-08-08 DIAGNOSIS — Z419 Encounter for procedure for purposes other than remedying health state, unspecified: Secondary | ICD-10-CM | POA: Diagnosis not present

## 2023-09-19 DIAGNOSIS — Z419 Encounter for procedure for purposes other than remedying health state, unspecified: Secondary | ICD-10-CM | POA: Diagnosis not present

## 2023-10-19 DIAGNOSIS — Z419 Encounter for procedure for purposes other than remedying health state, unspecified: Secondary | ICD-10-CM | POA: Diagnosis not present

## 2023-11-19 DIAGNOSIS — Z419 Encounter for procedure for purposes other than remedying health state, unspecified: Secondary | ICD-10-CM | POA: Diagnosis not present

## 2023-12-09 ENCOUNTER — Encounter: Payer: Self-pay | Admitting: General Practice

## 2023-12-19 DIAGNOSIS — Z419 Encounter for procedure for purposes other than remedying health state, unspecified: Secondary | ICD-10-CM | POA: Diagnosis not present

## 2023-12-24 ENCOUNTER — Ambulatory Visit: Admitting: Family Medicine

## 2024-01-19 DIAGNOSIS — Z419 Encounter for procedure for purposes other than remedying health state, unspecified: Secondary | ICD-10-CM | POA: Diagnosis not present

## 2024-02-19 DIAGNOSIS — Z419 Encounter for procedure for purposes other than remedying health state, unspecified: Secondary | ICD-10-CM | POA: Diagnosis not present

## 2024-03-20 DIAGNOSIS — Z419 Encounter for procedure for purposes other than remedying health state, unspecified: Secondary | ICD-10-CM | POA: Diagnosis not present
